# Patient Record
Sex: Female | Born: 1980 | Race: White | Hispanic: No | Marital: Married | Smoking: Never smoker
Health system: Southern US, Community
[De-identification: ages and names within clinical notes are randomized; demographics above are authoritative.]

## PROBLEM LIST (undated history)

## (undated) DIAGNOSIS — R51 Headache: Secondary | ICD-10-CM

## (undated) DIAGNOSIS — K635 Polyp of colon: Secondary | ICD-10-CM

## (undated) DIAGNOSIS — R569 Unspecified convulsions: Secondary | ICD-10-CM

## (undated) DIAGNOSIS — G8929 Other chronic pain: Secondary | ICD-10-CM

## (undated) DIAGNOSIS — J302 Other seasonal allergic rhinitis: Secondary | ICD-10-CM

## (undated) DIAGNOSIS — G40909 Epilepsy, unspecified, not intractable, without status epilepticus: Secondary | ICD-10-CM

## (undated) DIAGNOSIS — T7840XA Allergy, unspecified, initial encounter: Secondary | ICD-10-CM

## (undated) DIAGNOSIS — R519 Headache, unspecified: Secondary | ICD-10-CM

## (undated) HISTORY — DX: Headache: R51

## (undated) HISTORY — DX: Polyp of colon: K63.5

## (undated) HISTORY — DX: Headache, unspecified: R51.9

## (undated) HISTORY — DX: Other seasonal allergic rhinitis: J30.2

## (undated) HISTORY — DX: Epilepsy, unspecified, not intractable, without status epilepticus: G40.909

## (undated) HISTORY — DX: Allergy, unspecified, initial encounter: T78.40XA

## (undated) HISTORY — DX: Other chronic pain: G89.29

---
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
## 2004-06-24 ENCOUNTER — Ambulatory Visit (HOSPITAL_COMMUNITY): Admission: RE | Admit: 2004-06-24 | Discharge: 2004-06-24 | Payer: Self-pay | Admitting: Gynecology

## 2004-11-15 ENCOUNTER — Inpatient Hospital Stay (HOSPITAL_COMMUNITY): Admission: AD | Admit: 2004-11-15 | Discharge: 2004-11-17 | Payer: Self-pay | Admitting: Gynecology

## 2005-05-30 ENCOUNTER — Ambulatory Visit (HOSPITAL_COMMUNITY): Admission: RE | Admit: 2005-05-30 | Discharge: 2005-05-30 | Payer: Self-pay | Admitting: Gynecology

## 2005-08-25 ENCOUNTER — Emergency Department (HOSPITAL_COMMUNITY): Admission: EM | Admit: 2005-08-25 | Discharge: 2005-08-25 | Payer: Self-pay | Admitting: Family Medicine

## 2006-02-27 ENCOUNTER — Ambulatory Visit: Payer: Self-pay | Admitting: Gynecology

## 2007-02-12 ENCOUNTER — Ambulatory Visit: Payer: Self-pay | Admitting: Gynecology

## 2007-02-12 ENCOUNTER — Encounter (INDEPENDENT_AMBULATORY_CARE_PROVIDER_SITE_OTHER): Payer: Self-pay | Admitting: Gynecology

## 2007-07-02 ENCOUNTER — Ambulatory Visit: Payer: Self-pay | Admitting: Gynecology

## 2008-03-10 ENCOUNTER — Encounter (INDEPENDENT_AMBULATORY_CARE_PROVIDER_SITE_OTHER): Payer: Self-pay | Admitting: Gynecology

## 2008-03-10 ENCOUNTER — Ambulatory Visit: Payer: Self-pay | Admitting: Gynecology

## 2008-05-08 ENCOUNTER — Ambulatory Visit: Payer: Self-pay | Admitting: Gynecology

## 2008-05-19 ENCOUNTER — Ambulatory Visit (HOSPITAL_COMMUNITY): Admission: RE | Admit: 2008-05-19 | Discharge: 2008-05-19 | Payer: Self-pay | Admitting: Gynecology

## 2008-05-26 ENCOUNTER — Ambulatory Visit: Payer: Self-pay | Admitting: Family Medicine

## 2008-06-17 ENCOUNTER — Ambulatory Visit: Payer: Self-pay | Admitting: Obstetrics and Gynecology

## 2008-07-14 ENCOUNTER — Ambulatory Visit: Payer: Self-pay | Admitting: Obstetrics & Gynecology

## 2008-07-21 ENCOUNTER — Ambulatory Visit (HOSPITAL_COMMUNITY): Admission: RE | Admit: 2008-07-21 | Discharge: 2008-07-21 | Payer: Self-pay | Admitting: Gynecology

## 2008-08-12 ENCOUNTER — Ambulatory Visit: Payer: Self-pay | Admitting: Obstetrics & Gynecology

## 2008-09-09 ENCOUNTER — Ambulatory Visit: Payer: Self-pay | Admitting: Obstetrics & Gynecology

## 2008-09-29 ENCOUNTER — Ambulatory Visit: Payer: Self-pay | Admitting: Family Medicine

## 2008-09-29 LAB — CONVERTED CEMR LAB
HCT: 33.4 % — ABNORMAL LOW (ref 36.0–46.0)
Hemoglobin: 10.8 g/dL — ABNORMAL LOW (ref 12.0–15.0)
MCHC: 32.3 g/dL (ref 30.0–36.0)
MCV: 96.5 fL (ref 78.0–100.0)
Platelets: 158 10*3/uL (ref 150–400)
RBC: 3.46 M/uL — ABNORMAL LOW (ref 3.87–5.11)
RDW: 13.3 % (ref 11.5–15.5)
WBC: 7.9 10*3/uL (ref 4.0–10.5)

## 2008-10-06 ENCOUNTER — Ambulatory Visit: Payer: Self-pay | Admitting: Obstetrics and Gynecology

## 2008-10-27 ENCOUNTER — Ambulatory Visit: Payer: Self-pay | Admitting: Obstetrics and Gynecology

## 2008-11-11 ENCOUNTER — Ambulatory Visit: Payer: Self-pay | Admitting: Obstetrics & Gynecology

## 2008-11-24 ENCOUNTER — Ambulatory Visit: Payer: Self-pay | Admitting: Obstetrics & Gynecology

## 2008-11-24 ENCOUNTER — Encounter: Payer: Self-pay | Admitting: Family Medicine

## 2008-11-24 LAB — CONVERTED CEMR LAB: GC Probe Amp, Genital: NEGATIVE

## 2008-11-25 ENCOUNTER — Encounter: Payer: Self-pay | Admitting: Family Medicine

## 2008-12-02 ENCOUNTER — Ambulatory Visit: Payer: Self-pay | Admitting: Family Medicine

## 2008-12-08 ENCOUNTER — Ambulatory Visit: Payer: Self-pay | Admitting: Obstetrics and Gynecology

## 2008-12-16 ENCOUNTER — Ambulatory Visit: Payer: Self-pay | Admitting: Obstetrics & Gynecology

## 2008-12-21 ENCOUNTER — Inpatient Hospital Stay (HOSPITAL_COMMUNITY): Admission: AD | Admit: 2008-12-21 | Discharge: 2008-12-21 | Payer: Self-pay | Admitting: Gynecology

## 2008-12-22 ENCOUNTER — Ambulatory Visit: Payer: Self-pay | Admitting: Obstetrics and Gynecology

## 2008-12-26 ENCOUNTER — Ambulatory Visit: Payer: Self-pay | Admitting: Obstetrics & Gynecology

## 2008-12-26 ENCOUNTER — Inpatient Hospital Stay (HOSPITAL_COMMUNITY): Admission: RE | Admit: 2008-12-26 | Discharge: 2008-12-29 | Payer: Self-pay | Admitting: Obstetrics & Gynecology

## 2009-02-09 ENCOUNTER — Ambulatory Visit: Payer: Self-pay | Admitting: Obstetrics and Gynecology

## 2009-02-09 ENCOUNTER — Encounter: Payer: Self-pay | Admitting: Obstetrics and Gynecology

## 2009-02-09 ENCOUNTER — Encounter: Payer: Self-pay | Admitting: Family Medicine

## 2009-02-09 LAB — CONVERTED CEMR LAB
Hemoglobin: 11.9 g/dL — ABNORMAL LOW (ref 12.0–15.0)
MCHC: 31.7 g/dL (ref 30.0–36.0)
RBC: 4.07 M/uL (ref 3.87–5.11)
WBC: 5.5 10*3/uL (ref 4.0–10.5)

## 2009-02-23 ENCOUNTER — Ambulatory Visit: Payer: Self-pay | Admitting: Obstetrics & Gynecology

## 2009-03-23 ENCOUNTER — Ambulatory Visit: Payer: Self-pay | Admitting: Family Medicine

## 2009-03-26 ENCOUNTER — Encounter: Payer: Self-pay | Admitting: Family Medicine

## 2009-03-26 LAB — CONVERTED CEMR LAB
HCT: 37.8 % (ref 36.0–46.0)
Hemoglobin: 12.5 g/dL (ref 12.0–15.0)
MCHC: 33.1 g/dL (ref 30.0–36.0)
MCV: 89.2 fL (ref 78.0–100.0)
Platelets: 168 10*3/uL (ref 150–400)
RBC: 4.24 M/uL (ref 3.87–5.11)
RDW: 13.3 % (ref 11.5–15.5)
WBC: 3.4 10*3/uL — ABNORMAL LOW (ref 4.0–10.5)

## 2009-04-09 ENCOUNTER — Encounter: Payer: Self-pay | Admitting: Family Medicine

## 2009-04-09 ENCOUNTER — Ambulatory Visit: Payer: Self-pay | Admitting: Obstetrics & Gynecology

## 2009-04-09 LAB — CONVERTED CEMR LAB
HCT: 37.3 % (ref 36.0–46.0)
Hemoglobin: 12 g/dL (ref 12.0–15.0)
MCHC: 32.2 g/dL (ref 30.0–36.0)
Platelets: 230 10*3/uL (ref 150–400)
RBC: 4.08 M/uL (ref 3.87–5.11)

## 2009-10-30 IMAGING — CT CT ABDOMEN W/ CM
3 of 5 series · 14 of 32 positions shown, 18 images · IV contrast ([ID] OMNIP 300%)
Comparison: None relevant.

CT ABDOMEN

CLINICAL DATA: Postop cesarean section today with subsequent right
uterine artery hematoma requiring drainage.  Evaluate for ureteral
injury.

CT ABDOMEN AND PELVIS WITH CONTRAST
TECHNIQUE: Multidetector CT imaging of the abdomen and pelvis was
performed using the standard protocol following bolus
administration of intravenous contrast.
Contrast: 100 ml Xmnipaque-EII intravenously.

[Series 2: abd pelvis · axial · 0.70mm/px · z∈[-396,-186]mm · 3 of 86 slices shown, 7 images]
[im 22/86  soft-tissue]
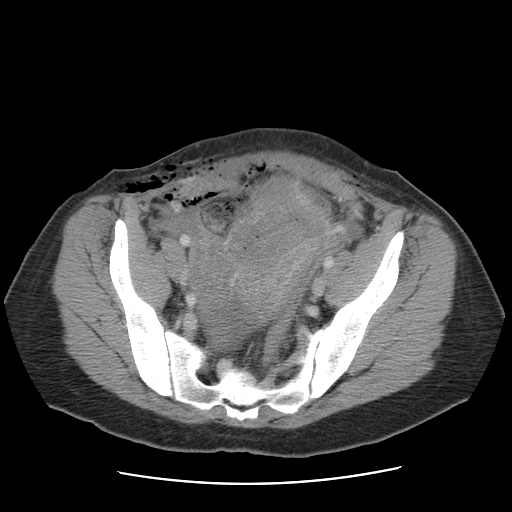
[im 22/86  lung]
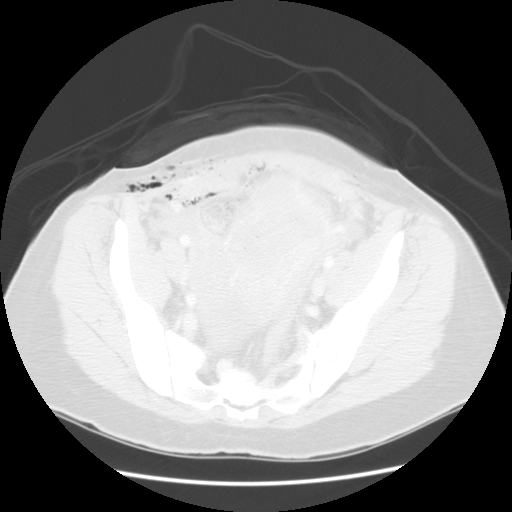
[im 22/86  bone]
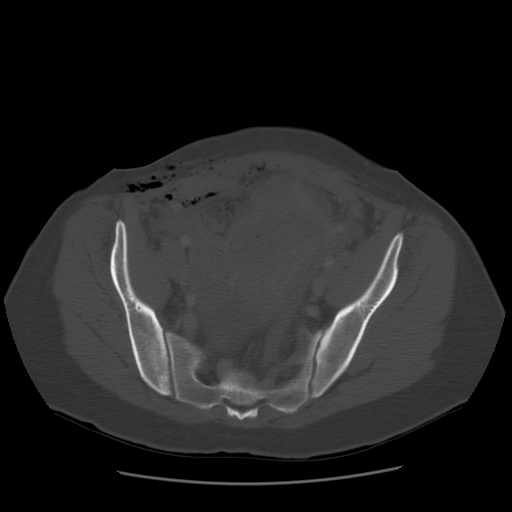
[im 43/86  soft-tissue]
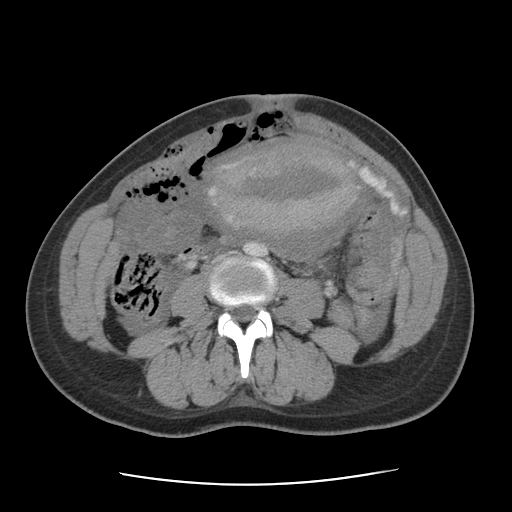
[im 43/86  lung]
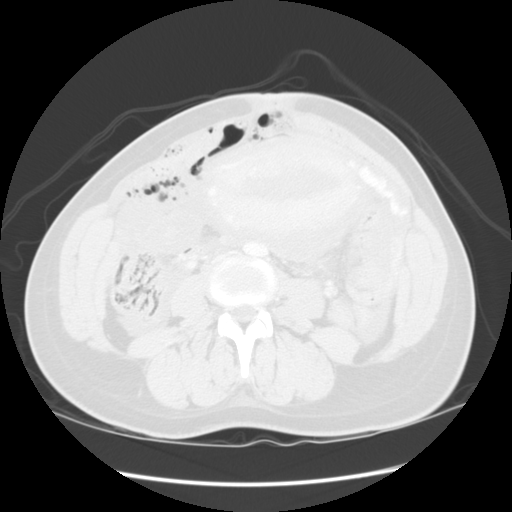
[im 64/86  soft-tissue]
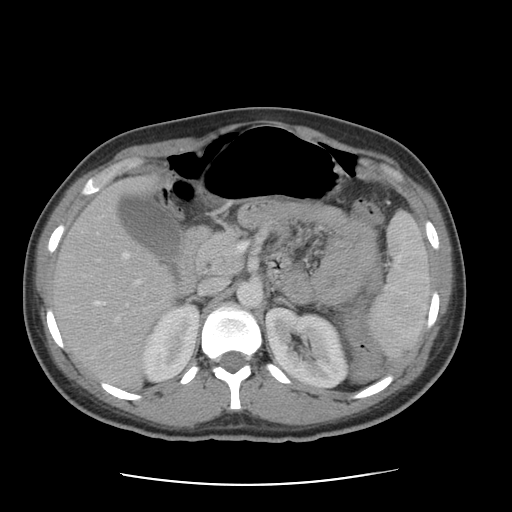
[im 64/86  lung]
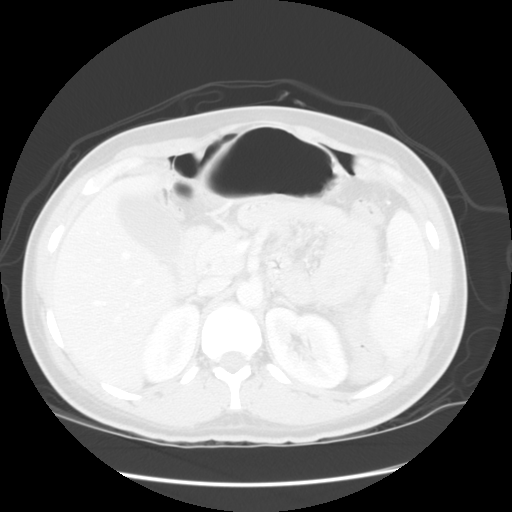

[Series 5: renal delay · axial · delayed · 0.70mm/px · z∈[-390,-220]mm · 3 of 69 slices shown]
[im 18/69  soft-tissue]
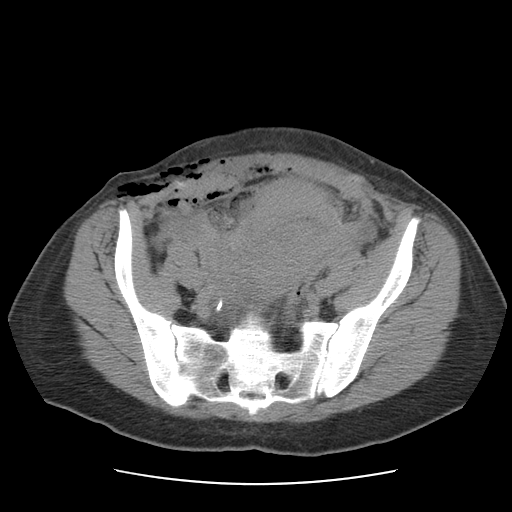
[im 35/69  soft-tissue]
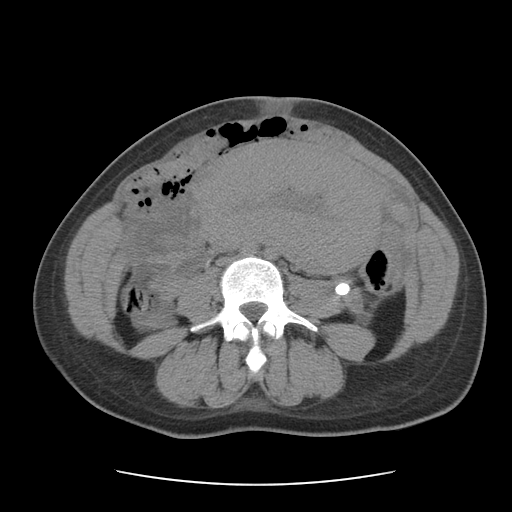
[im 52/69  soft-tissue]
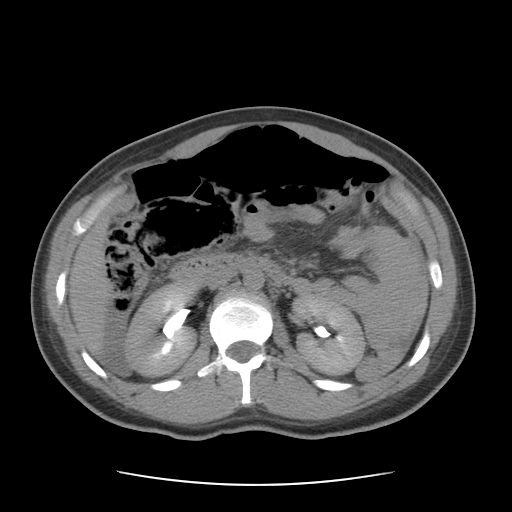

[Series 401: reformatted · sagittal · 0.86mm/px · 8 of 170 slices shown]
[im 17/170  soft-tissue]
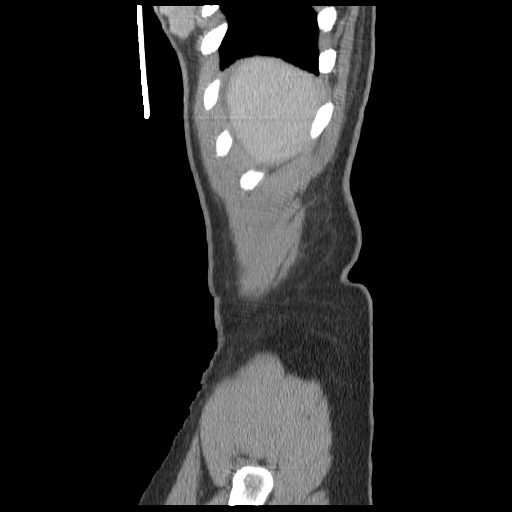
[im 34/170  soft-tissue]
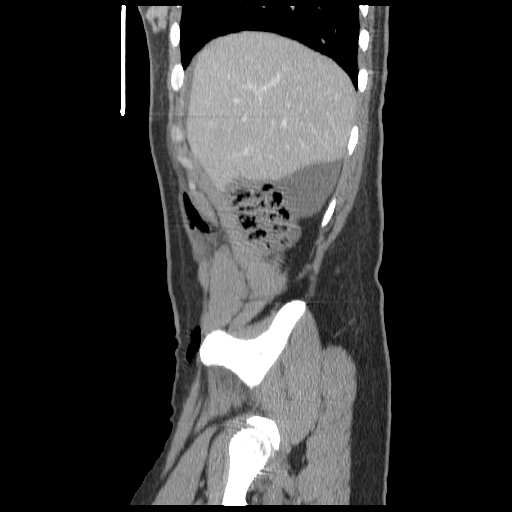
[im 51/170  soft-tissue]
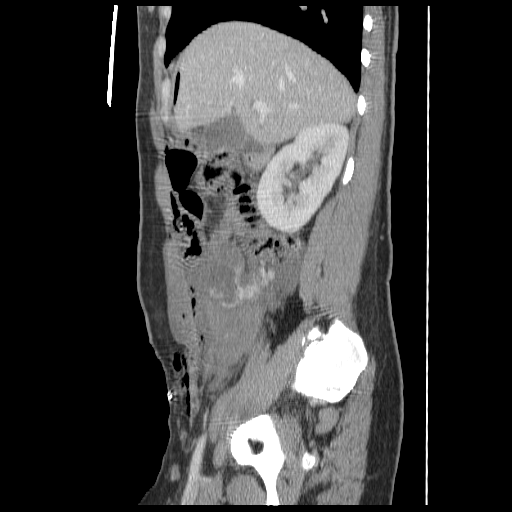
[im 68/170  soft-tissue]
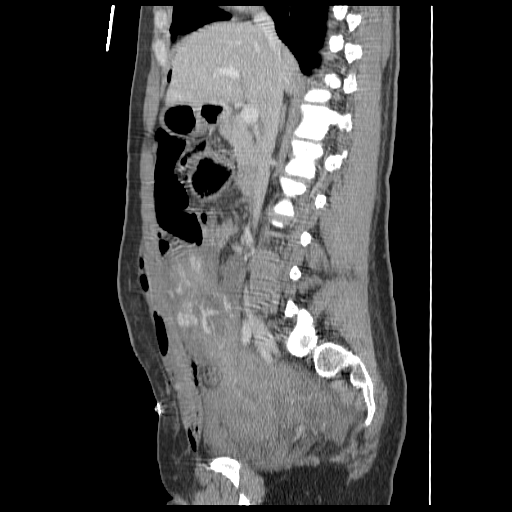
[im 102/170  soft-tissue]
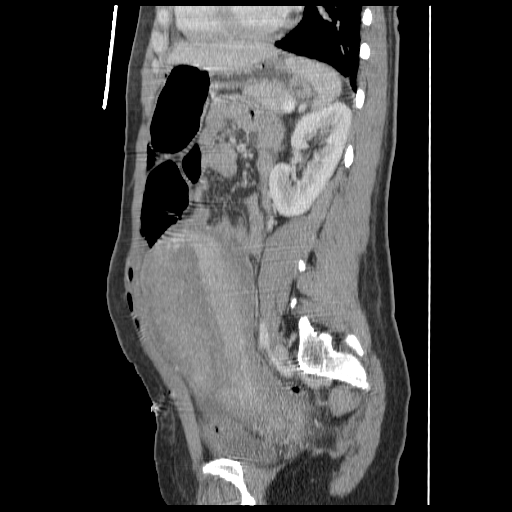
[im 119/170  soft-tissue]
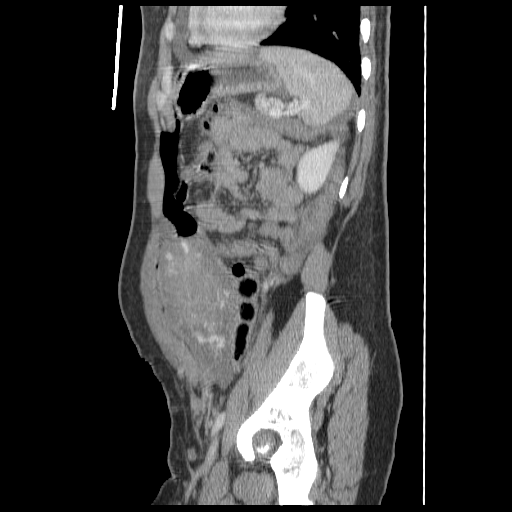
[im 136/170  soft-tissue]
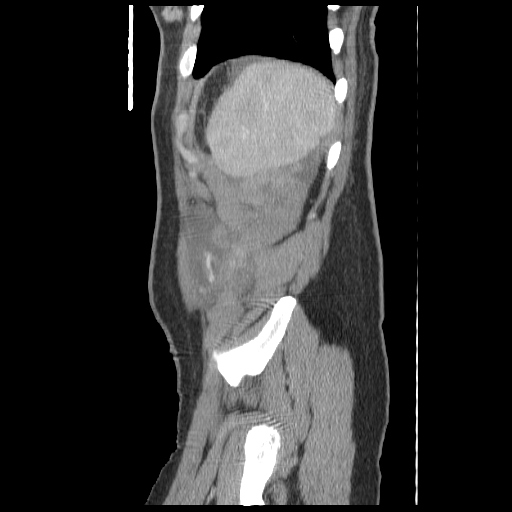
[im 153/170  soft-tissue]
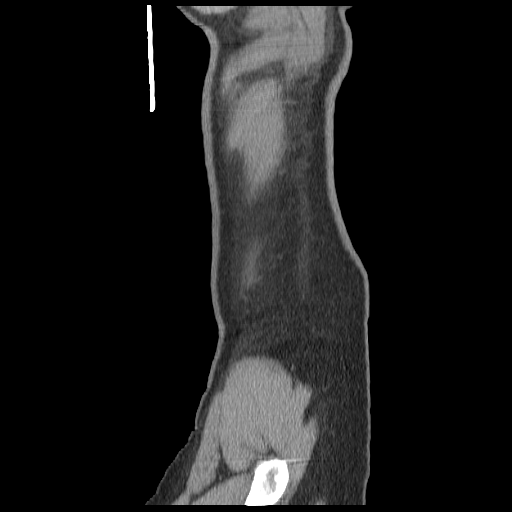

[14 of 32 positions shown; findings below may reference images not displayed]

FINDINGS: The lung bases are clear.  There is no pleural effusion
or basilar pneumothorax.  A small amount of pneumoperitoneum is
present attributed to the recent surgery.  There is a small to
moderate amount of low density ascites.  No definite active
bleeding or hematoma is identified within the abdomen; see below.

Both kidneys appear normal.  There is no hydronephrosis or delay in
contrast excretion.  Delayed images show no evidence of ureteral
injury or extravasation.  The right ureter can be traced to the
urinary bladder.

The liver, spleen, gallbladder, pancreas and adrenal glands appear
normal.
IMPRESSION: 1.  Postsurgical pneumoperitoneum and ascites or low density
hemoperitoneum status post cesarean section.
2.  No evidence of hydronephrosis or ureteral injury.

CT PELVIS
FINDINGS: Enlarged postpartum uterus measures 21.8 cm in length
and extends to the umbilicus.  There is some blood within the
endometrial canal.  There are engorged parametrial vessels and
gonadal veins bilaterally but no definite active bleeding is
identified.  However, there is a globular collection of contrast to
the left of the uterine fundus at the level of the iliac crest
(image 50).  As evaluated on the reformatted images, this is
probably a dilated vessel and does not appear to have enlarged on
the subsequent delayed images to suggest active bleeding.

There is a moderate amount of pelvic hemoperitoneum with hemorrhage
along the right pelvic side wall measuring up to 6.3 x 3.4 cm
transverse on image 70.  This does exert some mass effect on the
urinary bladder and lower uterine segment which are displaced to
the left.  The bladder is decompressed by a Foley catheter.  The
distal ureters appear normal.
IMPRESSION: 1.  No evidence of distal ureteral or bladder injury.
2.  Pelvic hemoperitoneum with known right pelvic side wall
hematoma.
3.  Enlarged postpartum uterus with engorged gonadal and
parametrial vessels.  No definite active bleeding is identified,
but that is difficult to completely exclude in the left peri fundal
region; correlate clinically.

I attempted to call the results to Dr. Josu at the time of
interpretation but was unable to reach her.  A message with my
contact information was left.  Dr. Suetam Ambrioso has reviewed the
results with Dr. Josu.

REF:G5 DICTATED: 12/26/2008 [DATE]

## 2010-03-09 ENCOUNTER — Ambulatory Visit: Payer: Self-pay | Admitting: Nurse Practitioner

## 2011-01-31 ENCOUNTER — Ambulatory Visit: Payer: Self-pay | Admitting: Obstetrics & Gynecology

## 2011-03-08 LAB — URINALYSIS, ROUTINE W REFLEX MICROSCOPIC
Bilirubin Urine: NEGATIVE
Glucose, UA: NEGATIVE mg/dL
Protein, ur: >300 mg/dL — AB

## 2011-03-08 LAB — CBC
HCT: 23 % — ABNORMAL LOW (ref 36.0–46.0)
HCT: 35.8 % — ABNORMAL LOW (ref 36.0–46.0)
Hemoglobin: 12 g/dL (ref 12.0–15.0)
Hemoglobin: 7.8 g/dL — CL (ref 12.0–15.0)
Hemoglobin: 8.6 g/dL — ABNORMAL LOW (ref 12.0–15.0)
Hemoglobin: 8.7 g/dL — ABNORMAL LOW (ref 12.0–15.0)
MCHC: 33.4 g/dL (ref 30.0–36.0)
MCHC: 33.6 g/dL (ref 30.0–36.0)
MCHC: 34 g/dL (ref 30.0–36.0)
MCHC: 34.2 g/dL (ref 30.0–36.0)
MCHC: 35 g/dL (ref 30.0–36.0)
MCV: 92.5 fL (ref 78.0–100.0)
MCV: 92.6 fL (ref 78.0–100.0)
Platelets: 105 10*3/uL — ABNORMAL LOW (ref 150–400)
Platelets: 112 10*3/uL — ABNORMAL LOW (ref 150–400)
Platelets: 140 10*3/uL — ABNORMAL LOW (ref 150–400)
Platelets: 98 10*3/uL — ABNORMAL LOW (ref 150–400)
RBC: 2.68 MIL/uL — ABNORMAL LOW (ref 3.87–5.11)
RDW: 14.4 % (ref 11.5–15.5)
RDW: 14.8 % (ref 11.5–15.5)
RDW: 14.9 % (ref 11.5–15.5)
WBC: 10.9 10*3/uL — ABNORMAL HIGH (ref 4.0–10.5)
WBC: 13.3 10*3/uL — ABNORMAL HIGH (ref 4.0–10.5)
WBC: 8 10*3/uL (ref 4.0–10.5)

## 2011-03-08 LAB — CROSSMATCH

## 2011-03-08 LAB — URINE MICROSCOPIC-ADD ON

## 2011-03-15 ENCOUNTER — Ambulatory Visit (INDEPENDENT_AMBULATORY_CARE_PROVIDER_SITE_OTHER): Payer: Commercial Managed Care - PPO | Admitting: Obstetrics and Gynecology

## 2011-03-15 DIAGNOSIS — Z113 Encounter for screening for infections with a predominantly sexual mode of transmission: Secondary | ICD-10-CM

## 2011-03-15 DIAGNOSIS — Z1272 Encounter for screening for malignant neoplasm of vagina: Secondary | ICD-10-CM

## 2011-03-15 DIAGNOSIS — Z01419 Encounter for gynecological examination (general) (routine) without abnormal findings: Secondary | ICD-10-CM

## 2011-03-16 NOTE — Assessment & Plan Note (Signed)
[name], [name] NO.:  [id number]  MEDICAL RECORD NO.:  [id number]          PATIENT TYPE:  LOCATION:  [facility] at [facility]           FACILITY:  PHYSICIAN:  Jaynie Collins, MD          DATE OF BIRTH:  DATE OF SERVICE:  03/15/2011                                 CLINIC NOTE  REASON FOR VISIT:  Annual examination.  [name] is a 30 year old gravida 2, para 2, who is here for annual gynecologic examination.  The patient does have a Mirena IUD in place and is basically amenorrheic.  She denies any irregular bleeding, abnormal pain, abnormal vaginal discharge, or any other gynecologic symptoms.  The patient does report having a sinus infection that has gotten worse that has persisted for over a week as she is requesting prescription antibiotics at this point to treat her sinus infection. She denies any fevers or any other systemic symptoms.  PAST OBSTETRIC/GYNECOLOGIC HISTORY:  Gravida 2, para 2, two cesarean sections.  No sexually transmitted infections.  Normal Pap smears, last one was in April 2011.  No other gynecologic conditions.  PAST MEDICAL HISTORY:  Epilepsy.  She has not had a seizures in a very long time and seasonal allergies.  PAST SURGICAL HISTORY:  Cesarean section x2.  MEDICATIONS: 1. Mirena IUD that was inserted February 23, 2009. 2. Keppra 750 mg twice a day. 3. Zyrtec.  ALLERGIES:  TEGRETOL which causes hives.  SOCIAL HISTORY:  The patient works as a Scientist, physiological at [employer].  She lives with her family.  She denies any tobacco, alcohol, or illicit drug use.  FAMILY HISTORY:  Remarkable for maternal grandfather who had lung cancer.  No gynecologic, breast, or colon cancer histories.  REVIEW OF SYSTEMS:  A 14-point comprehensive review of systems is entirely negative.  The patient does exercise frequently.  She wears sunscreens, wears her seatbelts, and has no other concerns.  PHYSICAL EXAMINATION:  VITAL SIGNS:   Blood pressure 135/70, pulse 75, weight 141 pounds, height 5 feet 7 inches. GENERAL:  No apparent distress. HEENT:  Normocephalic, atraumatic. NECK:  Supple.  Normal thyroid. LUNGS:  Clear to auscultation bilaterally. HEART:  Regular rate and rate and rhythm. BREASTS:  Symmetric in size, soft, nontender.  No abnormal masses, skin changes, nipple drainage, or lymphadenopathy. ABDOMEN:  Soft, nontender, nondistended.  No organomegaly.  A well-healed Pfannenstiel incision. EXTREMITIES:  No cyanosis, clubbing, or edema and nontender. PELVIC:  Normal external female genitalia.  Pink, well-rugated vagina. Normal cervical contour.  Normal discharge.  No lesions seen.  Pap smear was obtained, small mobile, retroverted uterus.  Normal adnexa bilaterally.  Mirena IUD's were visualized in the cervical canal.  ASSESSMENT AND PLAN:  The patient is a 30 year old gravida 2, para 2, here for annual gynecologic examination.  Pap smear was sent.  Today, she had a normal breast examination.  The patient has no other gynecologic complaints.  She was told to call or come back in for any further gynecologic concerns.  As for her sinus infection, the patient was given a prescription for the azithromycin (Z-Pak) and told to take as directed.  She was told to follow up with  her primary care physician if her symptoms do not get better.          ______________________________ Jaynie Collins, MD    UA/MEDQ  D:  03/15/2011  T:  03/16/2011  Job:  [id number]

## 2011-04-05 NOTE — Discharge Summary (Signed)
NAMEBRAELYNN, [name] NO.:  [id number]   MEDICAL RECORD NO.:  [id number]         PATIENT TYPE:  WINP   LOCATION:                                FACILITY:  [facility]   PHYSICIAN:  Norton Blizzard, MD    DATE OF BIRTH:  October 14, 1981   DATE OF ADMISSION:  12/26/2008  DATE OF DISCHARGE:                               DISCHARGE SUMMARY   DISCHARGE DIAGNOSES:  1. Repeat lower transverse cesarean section.  2. Right broad ligament retroperitoneal hematoma.  3. History of seizures.  4. Gastroesophageal reflux disease.  5. Anemia.   DISCHARGE MEDICATIONS:  1. Keppra.  2. Prenatal vitamin 1 p.o. daily.  3. Ibuprofen 600 mg 1 tablet p.o. q.6 h. as needed for postpartum      pain.  4. Iron sulfate 325 mg 1 tablet by mouth twice a day for postpartum      anemia.   LABS:  1. CBC on December 26, 2008: WBC 8.0, hemoglobin 12.0, hematocrit 35.8,      and platelet count 126.  2. Blood type A positive.  3. UA negative.  4. RPR nonreactive.  5. CBC on December 26, 2008 postsurgery:  WBC 12.3, hemoglobin 9.3, and      platelet count 98.  6. CBC on December 29, 2008:  WBC 8.3, hemoglobin 7.8, and platelet      count 138.   BRIEF HOSPITAL COURSE:  The patient is a 30 year old G2, P 2-0-0-2 with  a repeat low-transverse cesarean section, a right broad ligament  retroperitoneal hematoma, and subsequent anemia.  1. Repeat low transverse cesarean section:  The patient was taken to      the operating room on December 26, 2008 for repeat low transverse C-      section.  The patient was at 41 weeks and was taken to the OR      because of failure of spontaneous labor and for elective repeat C-      section.  A repeat low transverse cesarean section was performed      under normal fashion by Dr. Johnella Moloney.  This was done under      spinal anesthesia.  They delivered a viable female infant with      meconium-retained fluid.  Apgars were 9 and 9.  Weight was 9 pounds      5 ounces.  Spontaneous  placenta was intact with the three-vessel      cord.  After surgery, the patient underwent routine  postpartum      care.  On the day of discharge, the patient was feeling fine      without complaints.  She was up ambulating and was tolerating p.o.,      was voiding, and was only complaining of mild abdominal crampy      pain, well controlled with ibuprofen.  The patient's incision was      clean, dry, and intact.  The patient was in complete understanding      and agreement with discharge.  2. Right large hematoma from a laceration of the right uterine artery  into the broad ligament:  During the operation, the right uterine      artery was nicked and a 5 cm x 7 cm retroperitoneal hematoma      developed.  Dr. Osborn Coho was called into the OR to help and      the hematoma was contained.  The patient was given IV fluid bolus      as well as 1 unit of packed red blood cells.  Estimated blood loss      was 1000 mL.  After the procedure, the patient was monitored      closely.  The patient remained stable after surgery.  Serial CBCs      did show a slight downtrend in hemoglobin.  On the day of      discharge, the patient's hemoglobin was 7.8.  The patient was      asymptomatic from this anemia.  The patient denied any      lightheadedness, dizziness, fatigue, or feeling like her heart      tracing.  The patient had good cap refill in her extremities and      had good peripheral pulses.  The patient had no obvious signs of      retroperitoneal bleeding or bruising.  The patient has denied any      back or flank pain.  3. Anemia.  The patient's hemoglobin on admission was 12.0.  On the      day of discharge, it was 7.8.  The anemia is most likely from this      hematoma as well as to mild vaginal bleeding.  On the day of      discharge, the patient's vaginal bleeding was greatly decreased.      The patient had no signs of any active bleeding.  The patient will      be discharged  home with iron sulfate.  4. History of seizures.  The patient has not had seizures for 4 years.      However, we will continue the patient's Keppra.  The patient should      resume home dose of Keppra and will continue taking home dose of      Keppra.  5. Disposition.  The patient will be breast feeding.  The patient will      use Mirena with no bridge for contraception.  The patient will be      discharged home.   DISCHARGE INSTRUCTIONS:  The patient is to seek medical care for any  signs of bleeding, if she becomes lightheaded, dizzy, or develops  fatigue, the patient should have pelvic rest for 6 weeks.  The patient  has no restrictions on her diet.   FOLLOWUP:  The patient is to follow up with [facility] in 2 days for  to have her hemoglobin checked and should follow up in 6 weeks for her  routine postpartum care.   DISCHARGE CONDITION:  Stable.      Toni Sole, MD      Norton Blizzard, MD  Electronically Signed    WS/MEDQ  D:  12/29/2008  T:  12/29/2008  Job:  [phone]

## 2011-04-05 NOTE — Assessment & Plan Note (Signed)
[name], [name] NO.:  [id number]   MEDICAL RECORD NO.:  [id number]          PATIENT TYPE:  POB   LOCATION:  [facility] at [facility]         FACILITY:  [facility]   PHYSICIAN:  Jaynie Collins, MD     DATE OF BIRTH:  09-May-1981   DATE OF SERVICE:  03/09/2010                                  CLINIC NOTE   The patient is a 30 year old, gravida 2, para 2 with last menstrual  period February 10, 2010, who is here for annual gynecologic examination.  The patient denies any irregular bleeding, abnormal pain, abnormal  vaginal discharge, or any other gynecologic symptoms.   PAST OBSTETRICS AND GYNECOLOGIC HISTORY:  Gravida 2, para 2, 2 cesarean  sections.  Normal Pap smears.  Last one was in February 09, 2009.  No  sexually transmitted infections.   PAST MEDICAL HISTORY:  Epilepsy.   PAST SURGICAL HISTORY:  Cesarean section x2.   MEDICATIONS:  Mirena IUD which was inserted February 23, 2009, Keppra 750  mg.   ALLERGIES:  TEGRETOL which causes hives.   SOCIAL HISTORY:  The patient works as a Scientist, physiological at [employer].  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  She denies any  past or current history of physical or sexual abuse.  She denies any  tobacco, alcohol, or illicit drug use.   FAMILY HISTORY:  Remarkable for maternal grandfather, who had lung  cancer.  No gynecologic breast or colon cancers.   REVIEW OF SYSTEMS:  A 14-point comprehensive review of systems was  entirely negative.  The patient does exercise frequently, wears  sunscreen always,and has no other concerns.   PHYSICAL EXAMINATION:  VITAL SIGNS:  Blood pressure 126/79, pulse 76,  weight 141, height 5 feet 7 inches.  GENERAL:  No apparent distress.  HEENT:  Normocephalic, atraumatic.  NECK:  Supple.  Normal.  No masses.  Normal thyroid.  LUNGS:  Clear to auscultation bilaterally.  HEART:  Regular rate and rhythm.  BREASTS:  Symmetric in size, soft, nontender, no abnormal masses, skin  changes, drainage, or lymphadenopathy.  ABDOMEN:  Soft, nontender, nondistended.  No organomegaly.  Well-healed  Pfannenstiel incisions.  EXTREMITIES:  No clubbing, cyanosis, or edema.  Nontender.  PELVIC:  Normal external female genitalia.  Pink and well rugated  vagina.  Normal cervical contour.  Normal vaginal discharge.  Pap smear  was obtained.  On bimanual exam, the patient has a small mobile,  retroverted uterus, normal adnexa bilaterally.  No tenderness on  bimanual examination.  Of note, the Mirena IUD strings were visualized  and were in the cervical canal.  The strings were cut to be flush with  the external cervical os.   ASSESSMENT AND PLAN:  The patient is a 30 year old, gravida 2, para 2,  here for her annual gynecologic examination and Pap smear was sent.  We  will follow up results.  She also had a normal breast examination and no  other gynecologic symptoms.  The patient was told to call back if she  has any further concerns.  She is satisfied with her Mirena IUD for now  and the strings were visualized on  examination.  The patient was  encouraged to call, will come back in for any further gynecologic  concerns.           ______________________________  Jaynie Collins, MD     UA/MEDQ  D:  03/09/2010  T:  03/10/2010  Job:  [id number]

## 2011-04-05 NOTE — Assessment & Plan Note (Signed)
NAMEAMAYIA, [name] NO.:  [id number]   MEDICAL RECORD NO.:  [id number]          PATIENT TYPE:  POB   LOCATION:  [facility]         FACILITY:  [facility]   PHYSICIAN:  Ginger Carne, MD DATE OF BIRTH:  Aug 07, 1981   DATE OF SERVICE:  03/10/2008                                  CLINIC NOTE   Ms. Fredenburg returns today for routine gynecological evaluation.  She has  been attempting pregnancy since 07/2007.  Her menses are about every 35  days but at this time does not want intervention in the form of Clomid.  She is taking Keppra for seizure disorder which is a category C drug.  At this time she had stopped taking prenatal vitamins due to nausea and  I advised her to take two Flintstone's daily.  Her overall GYN exam is  normal for purposes of brevity. Please refer to the physical check off  sheet.  The patient was advised that if she is not pregnant in about 6  months or wishes to engage in fertility medications sooner to contact  our office. Patient was advised to increase her folic acid to 4 mg  daily.           ______________________________  Ginger Carne, MD     SHB/MEDQ  D:  03/10/2008  T:  03/10/2008  Job:  [id number]

## 2011-04-05 NOTE — Assessment & Plan Note (Signed)
[name], Toni NO.:  [id number]   MEDICAL RECORD NO.:  [id number]          PATIENT TYPE:  POB   LOCATION:  [facility]         FACILITY:  [facility]   PHYSICIAN:  Scheryl Darter, MD       DATE OF BIRTH:  06-23-1981   DATE OF SERVICE:                                  CLINIC NOTE   The patient comes for insertion of Mirena.  She had this inserted prior  to her last pregnancy.  __________ discussed and the risks were  discussed.  She signed consent.  She says she has been using condoms for  contraception.  She had negative UPT today.   PHYSICAL EXAMINATION:  GENERAL:  The patient is in no acute distress and  pleasant.  PELVIC:  External genital, vagina and cervix appeared normal.  Uterus  normal in size, nontender.  Cervix was grasped with single-tooth  tenaculum and prepped with Betadine.  Uterus sounded to 9 cm.  Mirena  was placed without difficulty.  The string was trimmed at 3 cm.  __________.  The patient tolerated the procedure well without  complications.  She will return in 4 weeks for examination.  She will  report if she has heavy bleeding, severe pain, abnormal discharge, or  fever.      Scheryl Darter, MD     JA/MEDQ  D:  02/23/2009  T:  02/24/2009  Job:  [id number]

## 2011-04-05 NOTE — Op Note (Signed)
[name], [name] NO.:  [id number]   MEDICAL RECORD NO.:  [id number]         PATIENT TYPE:  WINP   LOCATION:                                FACILITY:  [facility]   PHYSICIAN:  Norton Blizzard, MD    DATE OF BIRTH:  [birth year]   DATE OF PROCEDURE:  12/26/2008  DATE OF DISCHARGE:                               OPERATIVE REPORT   PREOPERATIVE DIAGNOSES:  1. Intrauterine pregnancy at 41 weeks' gestation.  2. Failure of spontaneous labor after a history of prior cesarean      section.  3. Desires elective repeat cesarean section.   POSTOPERATIVE DIAGNOSIS:  1. Intrauterine pregnancy at 41 weeks' gestation.  2. Failure of spontaneous labor after a history of prior cesarean      section.  3. Desires elective repeat cesarean section.   PROCEDURE:  Repeat low transverse cesarean section via Pfannenstiel  incision.   SURGEON:  Norton Blizzard, MD   ASSISTANT:  Odie Sera, DO, Osborn Coho, M.D., Claudean Severance, [place]  student.   ANESTHESIA:  Spinal.   IV FLUIDS:  3500 mL of lactated Ringer's and 1 unit of packed red blood  cells.   ESTIMATED BLOOD LOSS:  1000 mL.   URINE OUTPUT:  300 mL of clear yellow urine.   INDICATIONS:  The patient is a 30 year old gravida 2, para 1 at 74 weeks  who presented today for elective repeat cesarean section.  Prior during  her prenatal course, the patient did opt for trial of labor after a  cesarean section, but was unable to have spontaneous labor.  At her 29-  week visit, the patient decided to proceed with an elective repeat  cesarean section, which was scheduled today.  The risks of surgery,  which include but not limited to bleeding which require transfusion,  infection which require antibiotics, injury to surrounding organs  including the bladder, ureters, bowel and possible need for other  procedures including hysterectomy in the event of a life-threatening  bleed were discussed with the patient and written informed  consent was  obtained.   FINDINGS:  Viable female infant in cephalic presentation.  There was  light meconium stained amniotic fluid.  Apgars of the infant where 8 and  9.  Weight 9 pounds 5 ounces.  There was spontaneous placental delivery  intact with three-vessel cord.  There was a large hematoma from  laceration of the right uterine artery into the broad ligament that  measured about 5 cm in width and 7 cm in length.  Otherwise, normal  uterus, tubes, and ovaries bilaterally.   SPECIMENS:  Placenta and cord blood, which were sent to labor and  delivery.   COMPLICATIONS:  Hematoma from laceration of right uterine artery as  described above.   DESCRIPTION OF PROCEDURE:  The patient received preoperative intravenous  Ancef, and was taken to the operating room where a spinal analgesia was  administered and found to be adequate.  She was then placed in the  dorsal supine position with a leftward tilt, and prepped and draped in a  sterile  manner.  A Pfannenstiel incision was made over her preexisting  scar and carried down to the underlying layer of fascia using a scalpel.  The incision was incised in the midline and was extended bilaterally  using Mayo scissors.  Kochers were applied to the superior aspect of the  fascial incision and the underlying rectus muscles were dissected off  bluntly.  A similar process was carried out on the inferior aspect.  The  rectus muscles were separated in the midline bluntly and peritoneum was  entered bluntly.  This peritoneal incision was extended inferiorly and  superiorly with good visualization of bowel and bladder using a  combination of blunt and sharp methods.  Attention was then turned to  the patient's lower uterine segment where a low transverse hysterotomy  was made with a scalpel and extended bilaterally in a blunt fashion.  The aminotic sac was encountered and ruptured for lightly meconium  stained fluid.  The infant's head was  encountered and delivered  atraumatically.  The nares and mouth were suctioned with bulb suction  and the rest of the infant's body was delivered without complication.  The cord was clamped and cut and the infant was handed over to the  awaiting neonatologist.  Fundal massage was then administered and the  placenta delivered intact with three-vessel cord that was noted.  At  this point, it was noted that there was significant amount of bleeding  coming from the right side of the hysterotomy; a laceration into the  right uterine artery was suspected at this point and a ring forceps was  placed on this area.  The uterus was exteriorized and cleared of all  clot and debris.  The hysterotomy was then repaired using 0 Vicryl in a  running interlocking fashion with care given to incorporate the area  around the right uterine arteries.  Initially good hemostasis was noted  and an imbricating layer using 0 Monocryl was also done at this point.  Upon evaluation of the repair of the hysterotomy, it was noted that  there was a small hematoma that was noted to be arising from the right  lateral aspect of the hysterotomy.  No obvious external bleeding was  noted at this point and pressure was applied using laparotomy sponges  and it was observed for a few minutes while the ovaries and fallopian  tubes were examined and found to be normal.  When attention was returned  to the hematoma, it was noted to have expanded to about a 5 cm width and  an 7 cm length.  The decision was made to try to isolate the uterine  artery and the vessels on the right side for ligation.  The part of the  hysterotomy repair on the right was loosened and the bladder was further  dissected down from the right lateral aspect of the uterus.  At this  point, I did call in Dr. Osborn Coho, who was one of the attendings  in the operating room to assist with this procedure.  Stitches were then  placed using 3-0 Vicryl around the  uterine vessels in an attempt to  ligate the uterine vessels and obtain hemostasis.  A few figure-of-eight  stitches were placed around the vessels.  It was difficult to try to  demarcate where the ureter was in relation to where the stitches were  been placed, but there was not any obvious injury to the ureter that was  noted.  After a few stitches were placed  good hemostasis was observed on  the right and the hematoma was noted not be expanding.  The pelvis was  cleared of all clot and debris and the uterus was returned to the  abdomen.  A few more minutes were devoted to watch and to see if the  hematoma expanded, but this did not happen.  Given her blood loss, which  at this point was estimated to be about 800 mL from the procedure and  about 200 mL in hematoma, the decision was made to transfuse her 1 unit  of packed red blood cells, which she got using the rapid infuser in the  operating room.  Some Surgicel was also placed in the area of the  stitches for further hemostasis.  The peritoneum was then reapproximated  over the lower uterine segment using 3-0 Vicryl interrupted stitches.  Hemostasis was confirmed on all surfaces.  The fascia was then  reapproximated using 0 PDS in a running fashion.  The subcutaneous  layers were irrigated with a wet sponge and bleeders were controlled  using electrocautery.  The skin was then closed with staples.  The  patient tolerated the procedure and the transfusion well.  Sponge,  instrument and needle counts were correct x3.  She was taken to the  recovery room in stable condition.  Of  note, the patient will have a CBC that will be drawn 4 hours after her  transfusion to determine the need for further transfusion or further  bleeding into the hematoma or into her abdomen.  She will also be  scheduled for CT urogram after this procedure in order to evaluate for  possible injury to the right ureter.      Norton Blizzard, MD  Electronically  Signed     UAD/MEDQ  D:  12/26/2008  T:  12/27/2008  Job:  [id number]   cc:   Osborn Coho, M.D.  Fax: [phone]

## 2011-04-08 NOTE — Op Note (Signed)
[name], [name] NO.:  [id number]   MEDICAL RECORD NO.:  [id number]          PATIENT TYPE:  INP   LOCATION:  9106                          FACILITY:  [facility]   PHYSICIAN:  Ginger Carne, MD  DATE OF BIRTH:  October 23, 1981   DATE OF PROCEDURE:  11/15/2004  DATE OF DISCHARGE:                                 OPERATIVE REPORT   PREOPERATIVE DIAGNOSIS:  Cephalopelvic disproportion.   POSTOPERATIVE DIAGNOSES:  1.  Cephalopelvic disproportion.  2.  Viable delivery of female infant.   PROCEDURE:  Primary low transverse cesarean section.   SURGEON:  Dr. Mia Creek   ASSISTANT:  None.   COMPLICATIONS:  None immediate.   ESTIMATED BLOOD LOSS:  1500 mL.   COMPLICATIONS:  Uterine atony.   SPECIMEN:  Cord bloods.   OPERATIVE FINDINGS:  Term infant female was delivered on December 26, 205, in  the vertex presentation.  The fetus was posterior and deflexed.  The uterus,  tubes, and ovaries showed normal and usual changes of pregnancy.  Placenta  was complete three vessels with a central cord.  Amniotic fluid was clear  without foul smell.  The uterus developed atony immediately postpartum,  related to infant weighing 10 pounds 1 ounce.  Apgar and weight per delivery  room record.  The patient received Methergine 0.2 mg IM, Hemabate 250 mcg IM  in the thigh and 1000 mg of Cervidil per rectum.  With continued atony, a  series of 5 sutures were placed in a meatloaf fashion, starting below the  tubes bilaterally and extending down to the lower uterine segment just above  the transverse incision on the uterus.  At this point, the uterus was well-  contracted and tied down with minimal bleeding noted intravaginally.   OPERATIVE PROCEDURE:  The patient prepped and draped in the usual fashion  and placed in left lateral supine position.  Betadine solution used for  antiseptic, and the patient was catheterized prior to the procedure.  After  adequate spinal analgesia, a  Pfannenstiel incision was made and the abdomen  opened.  Lower uterine segment was incised transversely after developing the  bladder flap.  Baby delivered, cord clamped, cut, and infant given to the  pediatric staff after bulb suctioning.  The uterus removed manually, uterus  inspected.  Aforementioned procedures were performed to effect uterine  atony.  The uterus was closed in one layer with 0 Vicryl running  interlocking suture.  Following this, a meatloaf suture technique was  utilized, starting anteriorly and going to the posterior wall and through  the posterior wall of the uterus and starting immediately below the entrance  of the tubes on either side laterally.  Afterwards, the suture then was  placed through the posterior wall and exited through the anterior wall and  tied down.  A series of five of these sutures were placed, extending  immediately above the transverse uterine incision.  Uterus was well tied  down and clamped, minimal bleeding noted as described.  Bleeding points were  hemostatically checked, blood clots removed from the abdomen.  Closure of  the parietal peritoneum with 2-0 Vicryl suture, 0 Vicryl running for the  fascia, 3-0 Vicryl for the subcutaneous layer, and 4-0 Prolene for a  subcuticular closure.  The patient tolerated the procedure well and  transported to the postanesthesia recovery room in excellent condition.     Stev   SHB/MEDQ  D:  11/15/2004  T:  11/15/2004  Job:  [id number]

## 2011-04-08 NOTE — Discharge Summary (Signed)
[name], [name] NO.:  [id number]   MEDICAL RECORD NO.:  [id number]          PATIENT TYPE:  INP   LOCATION:  9106                          [facility]ACILITY:  [facility]   PHYSICIAN:  Ginger Carne, MD  DATE OF BIRTH:  1981/05/20   DATE OF ADMISSION:  11/15/2004  DATE OF DISCHARGE:                                 DISCHARGE SUMMARY   FINAL DIAGNOSES:  1.  Term viable delivery of female infant.  2.  Cephalopelvic disproportion.  3.  History of epilepsy, on medication.   IN-HOSPITAL PROCEDURES:  1.  Primary low transverse cesarean section.  2.  Medical induction.   HOSPITAL COURSE:  This is a 30 year old gravida 1, para 1 0-0-1 Caucasian  female who underwent a medical induction on her due date because of history  of epilepsy and the risks of stillborn being three times greater than the  general population.  The patient did not advance beyond 2 to 3 cm despite  adequate [place] units for at least 6 hours.  A 10 pound 1 ounce female was  delivered in the early evening of November 15, 2004.  Postoperative course  was uneventful.  She was afebrile.  Voided well.  Incision was dry and  minimal flow.  The patient did undergo an atony at the time of her surgery,  which was controlled with medications and additional suturing of the uterus.  Her postoperative hemoglobin was 9.3 from preoperative of 12.8 and postop  hematocrit 26.8% from preop of 32.9%.   Routine postoperative and postpartum instructions provided to said patient.  Percocet 5/325 prescribed for patient for analgesia and an intrauterine  device will be ordered for the patient (Mirena).  She will return in 2 to 3  days to have her subcuticular suture removed and then her routine 4 to 6  week postoperative and postpartum visit.     Stev   SHB/MEDQ  D:  11/17/2004  T:  11/17/2004  Job:  [id number]

## 2012-01-26 ENCOUNTER — Emergency Department (INDEPENDENT_AMBULATORY_CARE_PROVIDER_SITE_OTHER)
Admission: EM | Admit: 2012-01-26 | Discharge: 2012-01-26 | Disposition: A | Discharge status: Home / Self Care | Payer: Commercial Managed Care - PPO | Source: Home / Self Care

## 2012-01-26 ENCOUNTER — Encounter (HOSPITAL_COMMUNITY): Payer: Self-pay | Admitting: *Deleted

## 2012-01-26 DIAGNOSIS — S060XAA Concussion with loss of consciousness status unknown, initial encounter: Secondary | ICD-10-CM

## 2012-01-26 DIAGNOSIS — S060X9A Concussion with loss of consciousness of unspecified duration, initial encounter: Secondary | ICD-10-CM

## 2012-01-26 DIAGNOSIS — G40909 Epilepsy, unspecified, not intractable, without status epilepticus: Secondary | ICD-10-CM

## 2012-01-26 HISTORY — DX: Unspecified convulsions: R56.9

## 2012-01-26 NOTE — ED Notes (Signed)
Husband w/ patient and pt instructed to stay lying down and not to get up w/o assistance

## 2012-01-26 NOTE — ED Notes (Signed)
States had 3 complete seizures lasting approximately 2 minutes with 2 - 3 hours between seizures.  Pt states she has been tested repeatedly for epilepsy w/ negative results. C/o bumped head and states back of head is sore, states vomited Monday and states continues to be dizzy.

## 2012-01-26 NOTE — ED Provider Notes (Signed)
History     CSN: [id number]  Arrival date & time 01/26/12  1707   None     Chief Complaint  Patient presents with  . Seizures    (Consider location/radiation/quality/duration/timing/severity/associated sxs/prior treatment) HPI Comments: Patient presents this evening with her husband. She reports that she had 3 seizures 3 days ago. She has a history of seizure disorder, with her last seizure approximately 5 years ago. She is taking her seizure medication as prescribed and is uncertain what might have triggered the seizures. She states that she had the first seizure she was at work fell backwards and hit her head on the floor. Paramedics were called and she was evaluated. She denies headaches but states that she has some tenderness on the crown of her head where she hit her head on the floor. She's concerned because she continues to have intermittent dizziness for last 3 days since the head injury. She notices this with rolling over or with changing positions. She vomited twice on the day of symptoms seizures but none since then. Her appetite is normal and no nausea. She denies visual changes, numbness or tingling.   Past Medical History  Diagnosis Date  . Seizures     Past Surgical History  Procedure Date  . Cesarean section     History reviewed. No pertinent family history.  History  Substance Use Topics  . Smoking status: Never Smoker   . Smokeless tobacco: Not on file  . Alcohol Use: Yes    OB History    Grav Para Term Preterm Abortions TAB SAB Ect Mult Living   2 2              Review of Systems  All other systems reviewed and are negative.    Allergies  Tegretol  Home Medications   Current Outpatient Rx  Name Route Sig Dispense Refill  . LEVETIRACETAM 750 MG PO TABS Oral Take 750 mg by mouth every 12 (twelve) hours.      BP 113/71  Pulse 69  Temp(Src) 98.2 F (36.8 C) (Oral)  Resp 16  SpO2 100%  Physical Exam  Constitutional: She appears  well-developed and well-nourished. No distress.  HENT:  Head: Normocephalic and atraumatic. Head is without abrasion, without contusion and without laceration.    Right Ear: Tympanic membrane, external ear and ear canal normal.  Left Ear: Tympanic membrane, external ear and ear canal normal.  Nose: Nose normal.  Mouth/Throat: Uvula is midline, oropharynx is clear and moist and mucous membranes are normal. No oropharyngeal exudate, posterior oropharyngeal edema or posterior oropharyngeal erythema.  Eyes: Conjunctivae, EOM and lids are normal.  Fundoscopic exam:      The right eye shows no hemorrhage and no papilledema.       The left eye shows no hemorrhage and no papilledema.  Neck: Neck supple.  Cardiovascular: Normal rate, regular rhythm and normal heart sounds.   Pulmonary/Chest: Effort normal and breath sounds normal. No respiratory distress.  Musculoskeletal:       Cervical back: She exhibits normal range of motion, no tenderness, no swelling, no deformity and no spasm.  Lymphadenopathy:    She has no cervical adenopathy.  Neurological: She is alert. She has normal strength. No cranial nerve deficit. She displays a negative Romberg sign. Gait normal.  Reflex Scores:      Bicep reflexes are 2+ on the right side and 2+ on the left side. Skin: Skin is warm and dry.  Psychiatric: She has a normal mood  and affect.    ED Course  Procedures (including critical care time)  Labs Reviewed - No data to display No results found.   1. Mild concussion   2. Seizure disorder       MDM  Mild dizziness since head injury from seizures 3 days ago. Hx of seizure d/o. Pt does encouraged to schedule f/u appt with [facility] for next week. Advised no driving - husband states he has been driving her.          Melody Comas, PA 01/26/12 1957  Melody Comas, PA 01/26/12 1958

## 2012-01-26 NOTE — Discharge Instructions (Signed)
Increase fluids and rest. Tylenol, Ibuprofen, or Naproxen as needed for discomfort. Ice packs as needed to your scalp will also help with discomfort. Call Dr Zannie Cove office to schedule a follow up appt for next week. If your symptoms change or worsen go to the ER.

## 2012-01-27 NOTE — ED Provider Notes (Signed)
Medical screening examination/treatment/procedure(s) were performed by resident physician or non-physician practitioner and as supervising physician I was immediately available for consultation/collaboration.   Fahim Kats DOUGLAS MD.    Kiondre Grenz Douglas Aleanna Menge, MD 01/27/12 0825 

## 2012-04-18 ENCOUNTER — Encounter: Payer: Self-pay | Admitting: Family Medicine

## 2012-04-18 ENCOUNTER — Ambulatory Visit (INDEPENDENT_AMBULATORY_CARE_PROVIDER_SITE_OTHER): Payer: BC Managed Care – PPO | Admitting: Family Medicine

## 2012-04-18 VITALS — BP 94/62 | HR 70 | Ht 67.0 in | Wt 145.0 lb

## 2012-04-18 DIAGNOSIS — Z1151 Encounter for screening for human papillomavirus (HPV): Secondary | ICD-10-CM

## 2012-04-18 DIAGNOSIS — Z124 Encounter for screening for malignant neoplasm of cervix: Secondary | ICD-10-CM

## 2012-04-18 DIAGNOSIS — Z01419 Encounter for gynecological examination (general) (routine) without abnormal findings: Secondary | ICD-10-CM

## 2012-04-18 LAB — CBC
HCT: 37.5 % (ref 36.0–46.0)
Hemoglobin: 12.7 g/dL (ref 12.0–15.0)
MCH: 31.1 pg (ref 26.0–34.0)
MCHC: 33.9 g/dL (ref 30.0–36.0)
MCV: 91.9 fL (ref 78.0–100.0)
Platelets: 217 K/uL (ref 150–400)
RBC: 4.08 MIL/uL (ref 3.87–5.11)
RDW: 14.4 % (ref 11.5–15.5)
WBC: 4.5 K/uL (ref 4.0–10.5)

## 2012-04-18 LAB — LIPID PANEL
Cholesterol: 139 mg/dL (ref 0–200)
HDL: 64 mg/dL (ref >39)
LDL Cholesterol: 64 mg/dL (ref 0–99)
Total CHOL/HDL Ratio: 2.2 ratio
Triglycerides: 56 mg/dL (ref <150)
VLDL: 11 mg/dL (ref 0–40)

## 2012-04-18 LAB — COMPREHENSIVE METABOLIC PANEL WITH GFR
ALT: 11 U/L (ref 0–35)
AST: 15 U/L (ref 0–37)
Albumin: 4.6 g/dL (ref 3.5–5.2)
Alkaline Phosphatase: 46 U/L (ref 39–117)
BUN: 15 mg/dL (ref 6–23)
CO2: 25 meq/L (ref 19–32)
Calcium: 9.2 mg/dL (ref 8.4–10.5)
Chloride: 104 meq/L (ref 96–112)
Creat: 0.67 mg/dL (ref 0.50–1.10)
Glucose, Bld: 80 mg/dL (ref 70–99)
Potassium: 4.1 meq/L (ref 3.5–5.3)
Sodium: 138 meq/L (ref 135–145)
Total Bilirubin: 0.7 mg/dL (ref 0.3–1.2)
Total Protein: 6.9 g/dL (ref 6.0–8.3)

## 2012-04-18 NOTE — Patient Instructions (Signed)
Preventive Care for Adults, Female A healthy lifestyle and preventive care can promote health and wellness. Preventive health guidelines for women include the following key practices.  A routine yearly physical is a good way to check with your caregiver about your health and preventive screening. It is a chance to share any concerns and updates on your health, and to receive a thorough exam.   Visit your dentist for a routine exam and preventive care every 6 months. Brush your teeth twice a day and floss once a day. Good oral hygiene prevents tooth decay and gum disease.   The frequency of eye exams is based on your age, health, family medical history, use of contact lenses, and other factors. Follow your caregiver's recommendations for frequency of eye exams.   Eat a healthy diet. Foods like vegetables, fruits, whole grains, low-fat dairy products, and lean protein foods contain the nutrients you need without too many calories. Decrease your intake of foods high in solid fats, added sugars, and salt. Eat the right amount of calories for you.Get information about a proper diet from your caregiver, if necessary.   Regular physical exercise is one of the most important things you can do for your health. Most adults should get at least 150 minutes of moderate-intensity exercise (any activity that increases your heart rate and causes you to sweat) each week. In addition, most adults need muscle-strengthening exercises on 2 or more days a week.   Maintain a healthy weight. The body mass index (BMI) is a screening tool to identify possible weight problems. It provides an estimate of body fat based on height and weight. Your caregiver can help determine your BMI, and can help you achieve or maintain a healthy weight.For adults 20 years and older:   A BMI below 18.5 is considered underweight.   A BMI of 18.5 to 24.9 is normal.   A BMI of 25 to 29.9 is considered overweight.   A BMI of 30 and above is  considered obese.   Maintain normal blood lipids and cholesterol levels by exercising and minimizing your intake of saturated fat. Eat a balanced diet with plenty of fruit and vegetables. Blood tests for lipids and cholesterol should begin at age 20 and be repeated every 5 years. If your lipid or cholesterol levels are high, you are over 50, or you are at high risk for heart disease, you may need your cholesterol levels checked more frequently.Ongoing high lipid and cholesterol levels should be treated with medicines if diet and exercise are not effective.   If you smoke, find out from your caregiver how to quit. If you do not use tobacco, do not start.   If you are pregnant, do not drink alcohol. If you are breastfeeding, be very cautious about drinking alcohol. If you are not pregnant and choose to drink alcohol, do not exceed 1 drink per day. One drink is considered to be 12 ounces (355 mL) of beer, 5 ounces (148 mL) of wine, or 1.5 ounces (44 mL) of liquor.   Avoid use of street drugs. Do not share needles with anyone. Ask for help if you need support or instructions about stopping the use of drugs.   High blood pressure causes heart disease and increases the risk of stroke. Your blood pressure should be checked at least every 1 to 2 years. Ongoing high blood pressure should be treated with medicines if weight loss and exercise are not effective.   If you are 55 to 30   years old, ask your caregiver if you should take aspirin to prevent strokes.   Diabetes screening involves taking a blood sample to check your fasting blood sugar level. This should be done once every 3 years, after age 45, if you are within normal weight and without risk factors for diabetes. Testing should be considered at a younger age or be carried out more frequently if you are overweight and have at least 1 risk factor for diabetes.   Breast cancer screening is essential preventive care for women. You should practice "breast  self-awareness." This means understanding the normal appearance and feel of your breasts and may include breast self-examination. Any changes detected, no matter how small, should be reported to a caregiver. Women in their 20s and 30s should have a clinical breast exam (CBE) by a caregiver as part of a regular health exam every 1 to 3 years. After age 40, women should have a CBE every year. Starting at age 40, women should consider having a mammography (breast X-ray test) every year. Women who have a family history of breast cancer should talk to their caregiver about genetic screening. Women at a high risk of breast cancer should talk to their caregivers about having magnetic resonance imaging (MRI) and a mammography every year.   The Pap test is a screening test for cervical cancer. A Pap test can show cell changes on the cervix that might become cervical cancer if left untreated. A Pap test is a procedure in which cells are obtained and examined from the lower end of the uterus (cervix).   Women should have a Pap test starting at age 21.   Between ages 21 and 29, Pap tests should be repeated every 2 years.   Beginning at age 30, you should have a Pap test every 3 years as long as the past 3 Pap tests have been normal.   Some women have medical problems that increase the chance of getting cervical cancer. Talk to your caregiver about these problems. It is especially important to talk to your caregiver if a new problem develops soon after your last Pap test. In these cases, your caregiver may recommend more frequent screening and Pap tests.   The above recommendations are the same for women who have or have not gotten the vaccine for human papillomavirus (HPV).   If you had a hysterectomy for a problem that was not cancer or a condition that could lead to cancer, then you no longer need Pap tests. Even if you no longer need a Pap test, a regular exam is a good idea to make sure no other problems are  starting.   If you are between ages 65 and 70, and you have had normal Pap tests going back 10 years, you no longer need Pap tests. Even if you no longer need a Pap test, a regular exam is a good idea to make sure no other problems are starting.   If you have had past treatment for cervical cancer or a condition that could lead to cancer, you need Pap tests and screening for cancer for at least 20 years after your treatment.   If Pap tests have been discontinued, risk factors (such as a new sexual partner) need to be reassessed to determine if screening should be resumed.   The HPV test is an additional test that may be used for cervical cancer screening. The HPV test looks for the virus that can cause the cell changes on the cervix.   The cells collected during the Pap test can be tested for HPV. The HPV test could be used to screen women aged 30 years and older, and should be used in women of any age who have unclear Pap test results. After the age of 30, women should have HPV testing at the same frequency as a Pap test.   Colorectal cancer can be detected and often prevented. Most routine colorectal cancer screening begins at the age of 50 and continues through age 75. However, your caregiver may recommend screening at an earlier age if you have risk factors for colon cancer. On a yearly basis, your caregiver may provide home test kits to check for hidden blood in the stool. Use of a small camera at the end of a tube, to directly examine the colon (sigmoidoscopy or colonoscopy), can detect the earliest forms of colorectal cancer. Talk to your caregiver about this at age 50, when routine screening begins. Direct examination of the colon should be repeated every 5 to 10 years through age 75, unless early forms of pre-cancerous polyps or small growths are found.   Hepatitis C blood testing is recommended for all people born from 1945 through 1965 and any individual with known risks for hepatitis C.    Practice safe sex. Use condoms and avoid high-risk sexual practices to reduce the spread of sexually transmitted infections (STIs). STIs include gonorrhea, chlamydia, syphilis, trichomonas, herpes, HPV, and human immunodeficiency virus (HIV). Herpes, HIV, and HPV are viral illnesses that have no cure. They can result in disability, cancer, and death. Sexually active women aged 25 and younger should be checked for chlamydia. Older women with new or multiple partners should also be tested for chlamydia. Testing for other STIs is recommended if you are sexually active and at increased risk.   Osteoporosis is a disease in which the bones lose minerals and strength with aging. This can result in serious bone fractures. The risk of osteoporosis can be identified using a bone density scan. Women ages 65 and over and women at risk for fractures or osteoporosis should discuss screening with their caregivers. Ask your caregiver whether you should take a calcium supplement or vitamin D to reduce the rate of osteoporosis.   Menopause can be associated with physical symptoms and risks. Hormone replacement therapy is available to decrease symptoms and risks. You should talk to your caregiver about whether hormone replacement therapy is right for you.   Use sunscreen with sun protection factor (SPF) of 30 or more. Apply sunscreen liberally and repeatedly throughout the day. You should seek shade when your shadow is shorter than you. Protect yourself by wearing long sleeves, pants, a wide-brimmed hat, and sunglasses year round, whenever you are outdoors.   Once a month, do a whole body skin exam, using a mirror to look at the skin on your back. Notify your caregiver of new moles, moles that have irregular borders, moles that are larger than a pencil eraser, or moles that have changed in shape or color.   Stay current with required immunizations.   Influenza. You need a dose every fall (or winter). The composition of  the flu vaccine changes each year, so being vaccinated once is not enough.   Pneumococcal polysaccharide. You need 1 to 2 doses if you smoke cigarettes or if you have certain chronic medical conditions. You need 1 dose at age 65 (or older) if you have never been vaccinated.   Tetanus, diphtheria, pertussis (Tdap, Td). Get 1 dose of   Tdap vaccine if you are younger than age 65, are over 65 and have contact with an infant, are a healthcare worker, are pregnant, or simply want to be protected from whooping cough. After that, you need a Td booster dose every 10 years. Consult your caregiver if you have not had at least 3 tetanus and diphtheria-containing shots sometime in your life or have a deep or dirty wound.   HPV. You need this vaccine if you are a woman age 26 or younger. The vaccine is given in 3 doses over 6 months.   Measles, mumps, rubella (MMR). You need at least 1 dose of MMR if you were born in 1957 or later. You may also need a second dose.   Meningococcal. If you are age 19 to 21 and a first-year college student living in a residence hall, or have one of several medical conditions, you need to get vaccinated against meningococcal disease. You may also need additional booster doses.   Zoster (shingles). If you are age 60 or older, you should get this vaccine.   Varicella (chickenpox). If you have never had chickenpox or you were vaccinated but received only 1 dose, talk to your caregiver to find out if you need this vaccine.   Hepatitis A. You need this vaccine if you have a specific risk factor for hepatitis A virus infection or you simply wish to be protected from this disease. The vaccine is usually given as 2 doses, 6 to 18 months apart.   Hepatitis B. You need this vaccine if you have a specific risk factor for hepatitis B virus infection or you simply wish to be protected from this disease. The vaccine is given in 3 doses, usually over 6 months.  Preventive Services /  Frequency Ages 19 to 39  Blood pressure check.** / Every 1 to 2 years.   Lipid and cholesterol check.** / Every 5 years beginning at age 20.   Clinical breast exam.** / Every 3 years for women in their 20s and 30s.   Pap test.** / Every 2 years from ages 21 through 29. Every 3 years starting at age 30 through age 65 or 70 with a history of 3 consecutive normal Pap tests.   HPV screening.** / Every 3 years from ages 30 through ages 65 to 70 with a history of 3 consecutive normal Pap tests.   Hepatitis C blood test.** / For any individual with known risks for hepatitis C.   Skin self-exam. / Monthly.   Influenza immunization.** / Every year.   Pneumococcal polysaccharide immunization.** / 1 to 2 doses if you smoke cigarettes or if you have certain chronic medical conditions.   Tetanus, diphtheria, pertussis (Tdap, Td) immunization. / A one-time dose of Tdap vaccine. After that, you need a Td booster dose every 10 years.   HPV immunization. / 3 doses over 6 months, if you are 26 and younger.   Measles, mumps, rubella (MMR) immunization. / You need at least 1 dose of MMR if you were born in 1957 or later. You may also need a second dose.   Meningococcal immunization. / 1 dose if you are age 19 to 21 and a first-year college student living in a residence hall, or have one of several medical conditions, you need to get vaccinated against meningococcal disease. You may also need additional booster doses.   Varicella immunization.** / Consult your caregiver.   Hepatitis A immunization.** / Consult your caregiver. 2 doses, 6 to 18 months   apart.   Hepatitis B immunization.** / Consult your caregiver. 3 doses usually over 6 months.  Ages 40 to 64  Blood pressure check.** / Every 1 to 2 years.   Lipid and cholesterol check.** / Every 5 years beginning at age 20.   Clinical breast exam.** / Every year after age 40.   Mammogram.** / Every year beginning at age 40 and continuing for as  long as you are in good health. Consult with your caregiver.   Pap test.** / Every 3 years starting at age 30 through age 65 or 70 with a history of 3 consecutive normal Pap tests.   HPV screening.** / Every 3 years from ages 30 through ages 65 to 70 with a history of 3 consecutive normal Pap tests.   Fecal occult blood test (FOBT) of stool. / Every year beginning at age 50 and continuing until age 75. You may not need to do this test if you get a colonoscopy every 10 years.   Flexible sigmoidoscopy or colonoscopy.** / Every 5 years for a flexible sigmoidoscopy or every 10 years for a colonoscopy beginning at age 50 and continuing until age 75.   Hepatitis C blood test.** / For all people born from 1945 through 1965 and any individual with known risks for hepatitis C.   Skin self-exam. / Monthly.   Influenza immunization.** / Every year.   Pneumococcal polysaccharide immunization.** / 1 to 2 doses if you smoke cigarettes or if you have certain chronic medical conditions.   Tetanus, diphtheria, pertussis (Tdap, Td) immunization.** / A one-time dose of Tdap vaccine. After that, you need a Td booster dose every 10 years.   Measles, mumps, rubella (MMR) immunization. / You need at least 1 dose of MMR if you were born in 1957 or later. You may also need a second dose.   Varicella immunization.** / Consult your caregiver.   Meningococcal immunization.** / Consult your caregiver.   Hepatitis A immunization.** / Consult your caregiver. 2 doses, 6 to 18 months apart.   Hepatitis B immunization.** / Consult your caregiver. 3 doses, usually over 6 months.  Ages 65 and over  Blood pressure check.** / Every 1 to 2 years.   Lipid and cholesterol check.** / Every 5 years beginning at age 20.   Clinical breast exam.** / Every year after age 40.   Mammogram.** / Every year beginning at age 40 and continuing for as long as you are in good health. Consult with your caregiver.   Pap test.** /  Every 3 years starting at age 30 through age 65 or 70 with a 3 consecutive normal Pap tests. Testing can be stopped between 65 and 70 with 3 consecutive normal Pap tests and no abnormal Pap or HPV tests in the past 10 years.   HPV screening.** / Every 3 years from ages 30 through ages 65 or 70 with a history of 3 consecutive normal Pap tests. Testing can be stopped between 65 and 70 with 3 consecutive normal Pap tests and no abnormal Pap or HPV tests in the past 10 years.   Fecal occult blood test (FOBT) of stool. / Every year beginning at age 50 and continuing until age 75. You may not need to do this test if you get a colonoscopy every 10 years.   Flexible sigmoidoscopy or colonoscopy.** / Every 5 years for a flexible sigmoidoscopy or every 10 years for a colonoscopy beginning at age 50 and continuing until age 75.   Hepatitis   C blood test.** / For all people born from 1945 through 1965 and any individual with known risks for hepatitis C.   Osteoporosis screening.** / A one-time screening for women ages 65 and over and women at risk for fractures or osteoporosis.   Skin self-exam. / Monthly.   Influenza immunization.** / Every year.   Pneumococcal polysaccharide immunization.** / 1 dose at age 65 (or older) if you have never been vaccinated.   Tetanus, diphtheria, pertussis (Tdap, Td) immunization. / A one-time dose of Tdap vaccine if you are over 65 and have contact with an infant, are a healthcare worker, or simply want to be protected from whooping cough. After that, you need a Td booster dose every 10 years.   Varicella immunization.** / Consult your caregiver.   Meningococcal immunization.** / Consult your caregiver.   Hepatitis A immunization.** / Consult your caregiver. 2 doses, 6 to 18 months apart.   Hepatitis B immunization.** / Check with your caregiver. 3 doses, usually over 6 months.  ** Family history and personal history of risk and conditions may change your caregiver's  recommendations. Document Released: 01/03/2002 Document Revised: 10/27/2011 Document Reviewed: 04/04/2011 [employer] Patient Information 2012 [employer]. 

## 2012-04-18 NOTE — Progress Notes (Signed)
  Subjective:     [name] is a 31 y.o. female and is here for a comprehensive physical exam. The patient reports no problems.  History   Social History  . Marital Status: Married    Spouse Name: N/A    Number of Children: N/A  . Years of Education: N/A   Occupational History  . Not on file.   Social History Main Topics  . Smoking status: Never Smoker   . Smokeless tobacco: Not on file  . Alcohol Use: Yes     occasion  . Drug Use: No  . Sexually Active: Yes -- Female partner(s)    Birth Control/ Protection: IUD   Other Topics Concern  . Not on file   Social History Narrative  . No narrative on file   Health Maintenance  Topic Date Due  . Pap Smear  11/30/1999  . Tetanus/tdap  11/29/2000  . Influenza Vaccine  08/21/2012    The following portions of the patient's history were reviewed and updated as appropriate: allergies, current medications, past family history, past medical history, past social history, past surgical history and problem list.  Review of Systems A comprehensive review of systems was negative.   Objective:    BP 94/62  Pulse 70  Ht 5\' 7"  (1.702 m)  Wt 145 lb (65.772 kg)  BMI 22.71 kg/m2 General appearance: alert, cooperative and appears stated age Head: Normocephalic, without obvious abnormality, atraumatic Neck: no adenopathy, supple, symmetrical, trachea midline and thyroid not enlarged, symmetric, no tenderness/mass/nodules Lungs: clear to auscultation bilaterally Breasts: normal appearance, no masses or tenderness Heart: regular rate and rhythm, S1, S2 normal, no murmur, click, rub or gallop Abdomen: soft, non-tender; bowel sounds normal; no masses,  no organomegaly Pelvic: cervix normal in appearance, external genitalia normal, no adnexal masses or tenderness, no cervical motion tenderness, uterus normal size, shape, and consistency and vagina normal without discharge Extremities: extremities normal, atraumatic, no cyanosis or edema Pulses:  2+ and symmetric Skin: Skin color, texture, turgor normal. No rashes or lesions Lymph nodes: Cervical, supraclavicular, and axillary nodes normal. Neurologic: Grossly normal    Assessment:    Healthy female exam.      Plan:    Pap smear, continue IUD See After Visit Summary for Counseling Recommendations

## 2012-05-30 DIAGNOSIS — G40919 Epilepsy, unspecified, intractable, without status epilepticus: Secondary | ICD-10-CM | POA: Insufficient documentation

## 2012-11-21 HISTORY — PX: COLONOSCOPY: SHX174

## 2013-04-22 ENCOUNTER — Ambulatory Visit (INDEPENDENT_AMBULATORY_CARE_PROVIDER_SITE_OTHER): Payer: BC Managed Care – PPO | Admitting: Obstetrics & Gynecology

## 2013-04-22 ENCOUNTER — Encounter: Payer: Self-pay | Admitting: Obstetrics & Gynecology

## 2013-04-22 VITALS — BP 117/65 | HR 63 | Ht 67.0 in | Wt 155.0 lb

## 2013-04-22 DIAGNOSIS — Z01419 Encounter for gynecological examination (general) (routine) without abnormal findings: Secondary | ICD-10-CM

## 2013-04-22 DIAGNOSIS — Z1151 Encounter for screening for human papillomavirus (HPV): Secondary | ICD-10-CM

## 2013-04-22 DIAGNOSIS — Z124 Encounter for screening for malignant neoplasm of cervix: Secondary | ICD-10-CM

## 2013-04-22 DIAGNOSIS — Z0000 Encounter for general adult medical examination without abnormal findings: Secondary | ICD-10-CM

## 2013-04-22 NOTE — Progress Notes (Signed)
Here today for year gyn physical, no complaints.

## 2013-04-22 NOTE — Progress Notes (Signed)
Subjective:    [name] is a 32 y.o. female who presents for an annual exam. The patient has no complaints today.  The patient is sexually active. GYN screening history: last pap: was normal. The patient wears seatbelts: yes. The patient participates in regular exercise: no. Has the patient ever been transfused or tattooed?: yes.(tattoos) The patient reports that there is not domestic violence in her life.   Menstrual History: OB History   Grav Para Term Preterm Abortions TAB SAB Ect Mult Living   2 2        2       Menarche age: 85  No LMP recorded. Patient is not currently having periods (Reason: IUD).    The following portions of the patient's history were reviewed and updated as appropriate: allergies, current medications, past family history, past medical history, past social history, past surgical history and problem list.  Review of Systems A comprehensive review of systems was negative. Married for 9 years, denies dyspareunia. Uses Mirena, due to be replaced next year. Amenorrheic. 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   Objective:    BP 117/65  Pulse 63  Ht 5\' 7"  (1.702 m)  Wt 155 lb (70.308 kg)  BMI 24.27 kg/m2  General Appearance:    Alert, cooperative, no distress, appears stated age  Head:    Normocephalic, without obvious abnormality, atraumatic  Eyes:    PERRL, conjunctiva/corneas clear, EOM's intact, fundi    benign, both eyes  Ears:    Normal TM's and external ear canals, both ears  Nose:   Nares normal, septum midline, mucosa normal, no drainage    or sinus tenderness  Throat:   Lips, mucosa, and tongue normal; teeth and gums normal  Neck:   Supple, symmetrical, trachea midline, no adenopathy;    thyroid:  no enlargement/tenderness/nodules; no carotid   bruit or JVD  Back:     Symmetric, no curvature, ROM normal, no CVA tenderness  Lungs:     Clear to auscultation bilaterally, respirations unlabored  Chest Wall:    No tenderness or deformity   Heart:    Regular rate and rhythm, S1 and S2 normal, no murmur, rub   or gallop  Breast Exam:    No tenderness, masses, or nipple abnormality  Abdomen:     Soft, non-tender, bowel sounds active all four quadrants,    no masses, no organomegaly  Genitalia:    Normal female without lesion, discharge or tenderness, NSSA, NT, mobile, normal adnexa     Extremities:   Extremities normal, atraumatic, no cyanosis or edema  Pulses:   2+ and symmetric all extremities  Skin:   Skin color, texture, turgor normal, no rashes or lesions  Lymph nodes:   Cervical, supraclavicular, and axillary nodes normal  Neurologic:   CNII-XII intact, normal strength, sensation and reflexes    throughout  .    Assessment:    Healthy female exam.    Plan:     Thin prep Pap smear.  Rec SBE/SVE

## 2013-09-24 ENCOUNTER — Encounter: Payer: Self-pay | Admitting: Gastroenterology

## 2013-10-02 ENCOUNTER — Ambulatory Visit (INDEPENDENT_AMBULATORY_CARE_PROVIDER_SITE_OTHER): Payer: BC Managed Care – PPO | Admitting: Gastroenterology

## 2013-10-02 ENCOUNTER — Encounter: Payer: Self-pay | Admitting: Gastroenterology

## 2013-10-02 VITALS — BP 90/60 | HR 68 | Ht 67.0 in | Wt 159.6 lb

## 2013-10-02 DIAGNOSIS — K625 Hemorrhage of anus and rectum: Secondary | ICD-10-CM

## 2013-10-02 MED ORDER — NA SULFATE-K SULFATE-MG SULF 17.5-3.13-1.6 GM/177ML PO SOLN: 1.0000 | Freq: Once | ORAL | Status: DC

## 2013-10-02 NOTE — Patient Instructions (Signed)

## 2013-10-02 NOTE — Progress Notes (Signed)
History of Present Illness: 32 year old white female referred for evaluation of rectal bleeding.  On 2 occasions she has seen bright red blood mixed with her stools, in the water and on the tissue.  There is no history of change in bowel habits, abdominal or rectal pain.  She's had limited rectal bleeding in the past following a C-section.  She has no antecedent GI complaints.    Past Medical History  Diagnosis Date  . Seizures   . Epilepsy   . Allergy     seasonal  . Chronic headaches    Past Surgical History  Procedure Laterality Date  . Irrigation and debridement sebaceous cyst  1985  . Wisdom tooth extraction  2001  . Cesarean section      x2   family history includes Lung cancer in her maternal grandfather. Current Outpatient Prescriptions  Medication Sig Dispense Refill  . Fexofenadine HCl (ALLEGRA PO) Take by mouth.      . levETIRAcetam (KEPPRA) 750 MG tablet Take 2,250 mg by mouth 1 day or 1 dose.       . levonorgestrel (MIRENA) 20 MCG/24HR IUD 1 each by Intrauterine route once.       No current facility-administered medications for this visit.   Allergies as of 10/02/2013 - Review Complete 10/02/2013  Allergen Reaction Noted  . Tegretol [carbamazepine] Hives 01/26/2012    reports that she has never smoked. She has never used smokeless tobacco. She reports that she drinks alcohol. She reports that she does not use illicit drugs.     Review of Systems: She suffers from migraine headaches Pertinent positive and negative review of systems were noted in the above HPI section. All other review of systems were otherwise negative.  Vital signs were reviewed in today's medical record Physical Exam: General: Well developed , well nourished, no acute distress Skin: anicteric Head: Normocephalic and atraumatic Eyes:  sclerae anicteric, EOMI Ears: Normal auditory acuity Mouth: No deformity or lesions Neck: Supple, no masses or thyromegaly Lungs: Clear throughout to  auscultation Heart: Regular rate and rhythm; no murmurs, rubs or bruits Abdomen: Soft, non tender and non distended. No masses, hepatosplenomegaly or hernias noted. Normal Bowel sounds Rectal: There are no external lesions Musculoskeletal: Symmetrical with no gross deformities  Skin: No lesions on visible extremities Pulses:  Normal pulses noted Extremities: No clubbing, cyanosis, edema or deformities noted Neurological: Alert oriented x 4, grossly nonfocal Cervical Nodes:  No significant cervical adenopathy Inguinal Nodes: No significant inguinal adenopathy Psychological:  Alert and cooperative. Normal mood and affect

## 2013-10-02 NOTE — Assessment & Plan Note (Signed)
Limited rectal bleeding is most likely due to 2 internal hemorrhoids.  Other causes for bleeding including polyps, less likely AVMs and neoplasm should be excluded.  Recommendations #1 colonoscopy

## 2013-10-03 ENCOUNTER — Encounter: Payer: Self-pay | Admitting: Gastroenterology

## 2013-10-23 ENCOUNTER — Ambulatory Visit (AMBULATORY_SURGERY_CENTER): Payer: BC Managed Care – PPO | Admitting: Gastroenterology

## 2013-10-23 ENCOUNTER — Encounter: Payer: Self-pay | Admitting: Gastroenterology

## 2013-10-23 VITALS — BP 95/62 | HR 56 | Temp 96.9°F | Resp 20 | Ht 67.0 in | Wt 159.0 lb

## 2013-10-23 DIAGNOSIS — K625 Hemorrhage of anus and rectum: Secondary | ICD-10-CM

## 2013-10-23 DIAGNOSIS — K648 Other hemorrhoids: Secondary | ICD-10-CM

## 2013-10-23 DIAGNOSIS — D126 Benign neoplasm of colon, unspecified: Secondary | ICD-10-CM

## 2013-10-23 MED ORDER — SODIUM CHLORIDE 0.9 % IV SOLN: 500.0000 mL | INTRAVENOUS | Status: DC

## 2013-10-23 NOTE — Progress Notes (Signed)
Patient did not experience any of the following events: a burn prior to discharge; a fall within the facility; wrong site/side/patient/procedure/implant event; or a hospital transfer or hospital admission upon discharge from the facility. (G8907) Patient did not have preoperative order for IV antibiotic SSI prophylaxis. (G8918)  

## 2013-10-23 NOTE — Progress Notes (Signed)
Procedure ens, to recovery, report given and VSS  

## 2013-10-23 NOTE — Patient Instructions (Signed)
Discharge instructions given with verbal understanding. Handout on polyp and hemorrhoids. Resume previous medications. YOU HAD AN ENDOSCOPIC PROCEDURE TODAY AT THE [facility]: Refer to the procedure report that was given to you for any specific questions about what was found during the examination.  If the procedure report does not answer your questions, please call your gastroenterologist to clarify.  If you requested that your care partner not be given the details of your procedure findings, then the procedure report has been included in a sealed envelope for you to review at your convenience later.  YOU SHOULD EXPECT: Some feelings of bloating in the abdomen. Passage of more gas than usual.  Walking can help get rid of the air that was put into your GI tract during the procedure and reduce the bloating. If you had a lower endoscopy (such as a colonoscopy or flexible sigmoidoscopy) you may notice spotting of blood in your stool or on the toilet paper. If you underwent a bowel prep for your procedure, then you may not have a normal bowel movement for a few days.  DIET: Your first meal following the procedure should be a light meal and then it is ok to progress to your normal diet.  A half-sandwich or bowl of soup is an example of a good first meal.  Heavy or fried foods are harder to digest and may make you feel nauseous or bloated.  Likewise meals heavy in dairy and vegetables can cause extra gas to form and this can also increase the bloating.  Drink plenty of fluids but you should avoid alcoholic beverages for 24 hours.  ACTIVITY: Your care partner should take you home directly after the procedure.  You should plan to take it easy, moving slowly for the rest of the day.  You can resume normal activity the day after the procedure however you should NOT DRIVE or use heavy machinery for 24 hours (because of the sedation medicines used during the test).    SYMPTOMS TO REPORT IMMEDIATELY: A  gastroenterologist can be reached at any hour.  During normal business hours, 8:30 AM to 5:00 PM Monday through Friday, call [phone].  After hours and on weekends, please call the GI answering service at [phone] who will take a message and have the physician on call contact you.   Following lower endoscopy (colonoscopy or flexible sigmoidoscopy):  Excessive amounts of blood in the stool  Significant tenderness or worsening of abdominal pains  Swelling of the abdomen that is new, acute  Fever of 100F or higher  FOLLOW UP: If any biopsies were taken you will be contacted by phone or by letter within the next 1-3 weeks.  Call your gastroenterologist if you have not heard about the biopsies in 3 weeks.  Our staff will call the home number listed on your records the next business day following your procedure to check on you and address any questions or concerns that you may have at that time regarding the information given to you following your procedure. This is a courtesy call and so if there is no answer at the home number and we have not heard from you through the emergency physician on call, we will assume that you have returned to your regular daily activities without incident.  SIGNATURES/CONFIDENTIALITY: You and/or your care partner have signed paperwork which will be entered into your electronic medical record.  These signatures attest to the fact that that the information above on your After Visit Summary  has been reviewed and is understood.  Full responsibility of the confidentiality of this discharge information lies with you and/or your care-partner. 

## 2013-10-23 NOTE — Progress Notes (Signed)
Called to room to assist during endoscopic procedure.  Patient ID and intended procedure confirmed with present staff. Received instructions for my participation in the procedure from the performing physician.  

## 2013-10-23 NOTE — Op Note (Signed)
[facility] [address] [place], [address]   COLONOSCOPY PROCEDURE REPORT  PATIENT: [name], [name]  MR#: [id number] BIRTHDATE: 03-07-1981 , 32  yrs. old GENDER: Female ENDOSCOPIST: Louis Meckel, MD REFERRED BY: PROCEDURE DATE:  10/23/2013 PROCEDURE:   Colonoscopy with snare polypectomy and Submucosal injection, any substance First Screening Colonoscopy - Avg.  risk and is 50 yrs.  old or older Yes.  Prior Negative Screening - Now for repeat screening. N/A  History of Adenoma - Now for follow-up colonoscopy & has been > or = to 3 yrs.  N/A  Polyps Removed Today? Yes. ASA CLASS:   Class II INDICATIONS:Rectal Bleeding. MEDICATIONS: MAC sedation, administered by CRNA and Propofol (Diprivan) 440 mg IV  DESCRIPTION OF PROCEDURE:   After the risks benefits and alternatives of the procedure were thoroughly explained, informed consent was obtained.  A digital rectal exam revealed no abnormalities of the rectum.   The LB [id number]  endoscope was introduced through the anus and advanced to the cecum, which was identified by both the appendix and ileocecal valve. No adverse events experienced.   The quality of the prep was excellent using Suprep  The instrument was then slowly withdrawn as the colon was fully examined.      COLON FINDINGS: A sessile polyp measuring 15 mm in size was found in the ascending colon.  A polypectomy was performed using snare cautery.  The resection was complete and the polyp tissue was completely retrieved.  A saline Injection 5cc  was given to lift the mucosal wall. There was slight oozing from the polypectomy base.  Because of the size of the defect from polypectomy a single endoscopic clip was applied.there was no further bleeding. Retroflexed views revealed internal hemorrhoids. The time to cecum=7 minutes 53 seconds.  Withdrawal time=12 minutes 26 seconds. The scope was withdrawn and the procedure  completed. COMPLICATIONS: There were no complications.  ENDOSCOPIC IMPRESSION: 1.  Polyp ascending colon 2.  Internal hemorrhoids - source for rectal bleeding  RECOMMENDATIONS: 1.  If the polyp(s) removed today are proven to be adenomatous (pre-cancerous) polyps, you will need a colonoscopy in 3 years. Otherwise you should continue to follow colorectal cancer screening guidelines for "routine risk" patients with a colonoscopy in 10 years.  You will receive a letter within 1-2 weeks with the results of your biopsy as well as final recommendations.  Please call my office if you have not received a letter after 3 weeks. 2.  hemorrhoidal suppositories for recurrent bleeding   eSigned:  Louis Meckel, MD 10/23/2013 10:18 AM   cc: Jonette Eva, MD   PATIENT NAME:  [name], [name] MR#: [id number]

## 2013-10-24 ENCOUNTER — Telehealth: Payer: Self-pay | Admitting: *Deleted

## 2013-10-24 NOTE — Telephone Encounter (Signed)
Name identifier, left message, follow-up 

## 2013-10-29 ENCOUNTER — Encounter: Payer: Self-pay | Admitting: Gastroenterology

## 2014-02-25 ENCOUNTER — Ambulatory Visit (INDEPENDENT_AMBULATORY_CARE_PROVIDER_SITE_OTHER): Payer: BC Managed Care – PPO | Admitting: Family Medicine

## 2014-02-25 ENCOUNTER — Encounter: Payer: Self-pay | Admitting: Family Medicine

## 2014-02-25 VITALS — BP 126/80 | HR 64 | Ht 67.0 in | Wt 160.0 lb

## 2014-02-25 DIAGNOSIS — Z30433 Encounter for removal and reinsertion of intrauterine contraceptive device: Secondary | ICD-10-CM

## 2014-02-25 NOTE — Progress Notes (Signed)
    Subjective: Patient here for IUD change.  Has been in for 5 years and due to be changed. She has no cycles and desires to continue Mirena IUD for contraception.  Objective: Filed Vitals:   02/25/14 1536  BP: 126/80  Pulse: 64    Patient identified, informed consent performed, signed copy in chart, time out was performed Procedure: Speculum placed inside vagina.  Cervix visualized. Strings were not visualized and gentle cervical dilation performed until IUD strings grasped with ring forceps.  IUD removed intact.  Procedure: Cervix visualized.  Cleaned with Betadine x 2.  Grasped anteriourly with a single tooth tenaculum.  Uterus sounded to 6.5 cm.  Mirena IUD placed per manufacturer's recommendations.  Strings trimmed to 3 cm.   Patient given post procedure instructions and Mirena care card with expiration date.  Patient is asked to check IUD strings periodically and follow up in 4-6 weeks for IUD check.  Impression: Encounter for IUD removal and reinsertion   Plan: Continue IUD for 5 years.

## 2014-02-25 NOTE — Patient Instructions (Signed)
Levonorgestrel intrauterine device (IUD) What is this medicine? LEVONORGESTREL IUD (LEE voe nor jes trel) is a contraceptive (birth control) device. The device is placed inside the uterus by a healthcare professional. It is used to prevent pregnancy and can also be used to treat heavy bleeding that occurs during your period. Depending on the device, it can be used for 3 to 5 years. This medicine may be used for other purposes; ask your health care provider or pharmacist if you have questions. COMMON BRAND NAME(S): Mirena, Skyla What should I tell my health care provider before I take this medicine? They need to know if you have any of these conditions: -abnormal Pap smear -cancer of the breast, uterus, or cervix -diabetes -endometritis -genital or pelvic infection now or in the past -have more than one sexual partner or your partner has more than one partner -heart disease -history of an ectopic or tubal pregnancy -immune system problems -IUD in place -liver disease or tumor -problems with blood clots or take blood-thinners -use intravenous drugs -uterus of unusual shape -vaginal bleeding that has not been explained -an unusual or allergic reaction to levonorgestrel, other hormones, silicone, or polyethylene, medicines, foods, dyes, or preservatives -pregnant or trying to get pregnant -breast-feeding How should I use this medicine? This device is placed inside the uterus by a health care professional. Talk to your pediatrician regarding the use of this medicine in children. Special care may be needed. Overdosage: If you think you have taken too much of this medicine contact a poison control center or emergency room at once. NOTE: This medicine is only for you. Do not share this medicine with others. What if I miss a dose? This does not apply. What may interact with this medicine? Do not take this medicine with any of the following  medications: -amprenavir -bosentan -fosamprenavir This medicine may also interact with the following medications: -aprepitant -barbiturate medicines for inducing sleep or treating seizures -bexarotene -griseofulvin -medicines to treat seizures like carbamazepine, ethotoin, felbamate, oxcarbazepine, phenytoin, topiramate -modafinil -pioglitazone -rifabutin -rifampin -rifapentine -some medicines to treat HIV infection like atazanavir, indinavir, lopinavir, nelfinavir, tipranavir, ritonavir -St. John's wort -warfarin This list may not describe all possible interactions. Give your health care provider a list of all the medicines, herbs, non-prescription drugs, or dietary supplements you use. Also tell them if you smoke, drink alcohol, or use illegal drugs. Some items may interact with your medicine. What should I watch for while using this medicine? Visit your doctor or health care professional for regular check ups. See your doctor if you or your partner has sexual contact with others, becomes HIV positive, or gets a sexual transmitted disease. This product does not protect you against HIV infection (AIDS) or other sexually transmitted diseases. You can check the placement of the IUD yourself by reaching up to the top of your vagina with clean fingers to feel the threads. Do not pull on the threads. It is a good habit to check placement after each menstrual period. Call your doctor right away if you feel more of the IUD than just the threads or if you cannot feel the threads at all. The IUD may come out by itself. You may become pregnant if the device comes out. If you notice that the IUD has come out use a backup birth control method like condoms and call your health care provider. Using tampons will not change the position of the IUD and are okay to use during your period. What side effects may I   notice from receiving this medicine? Side effects that you should report to your doctor or  health care professional as soon as possible: -allergic reactions like skin rash, itching or hives, swelling of the face, lips, or tongue -fever, flu-like symptoms -genital sores -high blood pressure -no menstrual period for 6 weeks during use -pain, swelling, warmth in the leg -pelvic pain or tenderness -severe or sudden headache -signs of pregnancy -stomach cramping -sudden shortness of breath -trouble with balance, talking, or walking -unusual vaginal bleeding, discharge -yellowing of the eyes or skin Side effects that usually do not require medical attention (report to your doctor or health care professional if they continue or are bothersome): -acne -breast pain -change in sex drive or performance -changes in weight -cramping, dizziness, or faintness while the device is being inserted -headache -irregular menstrual bleeding within first 3 to 6 months of use -nausea This list may not describe all possible side effects. Call your doctor for medical advice about side effects. You may report side effects to FDA at 1-800-FDA-1088. Where should I keep my medicine? This does not apply. NOTE: This sheet is a summary. It may not cover all possible information. If you have questions about this medicine, talk to your doctor, pharmacist, or health care provider.  2014, Elsevier/Gold Standard. (2011-12-08 13:54:04)  

## 2014-04-22 ENCOUNTER — Ambulatory Visit: Payer: BC Managed Care – PPO | Admitting: Family Medicine

## 2014-09-22 ENCOUNTER — Encounter: Payer: Self-pay | Admitting: Family Medicine

## 2015-09-29 ENCOUNTER — Encounter: Payer: Self-pay | Admitting: *Deleted

## 2015-11-22 HISTORY — PX: POLYPECTOMY: SHX149

## 2015-11-22 HISTORY — PX: COLONOSCOPY: SHX174

## 2016-07-06 ENCOUNTER — Ambulatory Visit (INDEPENDENT_AMBULATORY_CARE_PROVIDER_SITE_OTHER): Payer: BC Managed Care – PPO | Admitting: Family Medicine

## 2016-07-06 ENCOUNTER — Encounter: Payer: Self-pay | Admitting: *Deleted

## 2016-07-06 ENCOUNTER — Encounter: Payer: Self-pay | Admitting: Family Medicine

## 2016-07-06 VITALS — BP 124/88 | Resp 18 | Ht 67.0 in | Wt 154.0 lb

## 2016-07-06 DIAGNOSIS — Z1151 Encounter for screening for human papillomavirus (HPV): Secondary | ICD-10-CM | POA: Diagnosis not present

## 2016-07-06 DIAGNOSIS — Z124 Encounter for screening for malignant neoplasm of cervix: Secondary | ICD-10-CM

## 2016-07-06 DIAGNOSIS — Z01419 Encounter for gynecological examination (general) (routine) without abnormal findings: Secondary | ICD-10-CM | POA: Diagnosis not present

## 2016-07-06 DIAGNOSIS — K625 Hemorrhage of anus and rectum: Secondary | ICD-10-CM | POA: Diagnosis not present

## 2016-07-06 NOTE — Progress Notes (Signed)
   Subjective:     [name] is a 35 y.o. female and is here for a comprehensive physical exam. The patient reports no problems. Has no cycle with IUD. She is a Educational psychologist at [employer].  Social History   Social History  . Marital status: Married    Spouse name: N/A  . Number of children: 2  . Years of education: N/A   Occupational History  . [employer]   Social History Main Topics  . Smoking status: Never Smoker  . Smokeless tobacco: Never Used  . Alcohol use Yes     Comment: occasion  . Drug use: No  . Sexual activity: Yes    Partners: Male    Birth control/ protection: IUD   Other Topics Concern  . Not on file   Social History Narrative  . No narrative on file   Health Maintenance  Topic Date Due  . TETANUS/TDAP  11/29/2000  . PAP SMEAR  04/22/2016  . INFLUENZA VACCINE  06/21/2016  . COLONOSCOPY  10/23/2016  . HIV Screening  Completed    The following portions of the patient's history were reviewed and updated as appropriate: allergies, current medications, past family history, past medical history, past social history, past surgical history and problem list.  Review of Systems Pertinent items noted in HPI and remainder of comprehensive ROS otherwise negative.   Objective:    BP 124/88 (BP Location: Left Arm, Patient Position: Sitting, Cuff Size: Normal)   Resp 18   Ht 5\' 7"  (1.702 m)   Wt 154 lb (69.9 kg)   BMI 24.12 kg/m  General appearance: alert, cooperative and appears stated age Head: Normocephalic, without obvious abnormality, atraumatic Neck: no adenopathy, supple, symmetrical, trachea midline and thyroid not enlarged, symmetric, no tenderness/mass/nodules Lungs: clear to auscultation bilaterally Breasts: normal appearance, no masses or tenderness Heart: regular rate and rhythm, S1, S2 normal, no murmur, click, rub or gallop Abdomen: soft, non-tender; bowel sounds normal; no masses,  no organomegaly Pelvic: cervix normal in  appearance, external genitalia normal, no adnexal masses or tenderness, no cervical motion tenderness, uterus normal size, shape, and consistency, vagina normal without discharge and IUD strings visualized Extremities: extremities normal, atraumatic, no cyanosis or edema Pulses: 2+ and symmetric Skin: Skin color, texture, turgor normal. No rashes or lesions Lymph nodes: Cervical, supraclavicular, and axillary nodes normal. Neurologic: Grossly normal    Assessment:    Healthy female exam.      Plan:   Problem List Items Addressed This Visit      Unprioritized   Hemorrhage of rectum and anus    Other Visit Diagnoses    Encounter for routine gynecological examination    -  Primary   Screening for cervical cancer       Relevant Orders   Cytology - PAP     Return for fasting labs. Continue IUD See After Visit Summary for Counseling Recommendations

## 2016-07-06 NOTE — Patient Instructions (Signed)
Preventive Care for Adults, Female A healthy lifestyle and preventive care can promote health and wellness. Preventive health guidelines for women include the following key practices.  A routine yearly physical is a good way to check with your health care provider about your health and preventive screening. It is a chance to share any concerns and updates on your health and to receive a thorough exam.  Visit your dentist for a routine exam and preventive care every 6 months. Brush your teeth twice a day and floss once a day. Good oral hygiene prevents tooth decay and gum disease.  The frequency of eye exams is based on your age, health, family medical history, use of contact lenses, and other factors. Follow your health care provider's recommendations for frequency of eye exams.  Eat a healthy diet. Foods like vegetables, fruits, whole grains, low-fat dairy products, and lean protein foods contain the nutrients you need without too many calories. Decrease your intake of foods high in solid fats, added sugars, and salt. Eat the right amount of calories for you.Get information about a proper diet from your health care provider, if necessary.  Regular physical exercise is one of the most important things you can do for your health. Most adults should get at least 150 minutes of moderate-intensity exercise (any activity that increases your heart rate and causes you to sweat) each week. In addition, most adults need muscle-strengthening exercises on 2 or more days a week.  Maintain a healthy weight. The body mass index (BMI) is a screening tool to identify possible weight problems. It provides an estimate of body fat based on height and weight. Your health care provider can find your BMI and can help you achieve or maintain a healthy weight.For adults 20 years and older:  A BMI below 18.5 is considered underweight.  A BMI of 18.5 to 24.9 is normal.  A BMI of 25 to 29.9 is considered overweight.  A  BMI of 30 and above is considered obese.  Maintain normal blood lipids and cholesterol levels by exercising and minimizing your intake of saturated fat. Eat a balanced diet with plenty of fruit and vegetables. Blood tests for lipids and cholesterol should begin at age 45 and be repeated every 5 years. If your lipid or cholesterol levels are high, you are over 50, or you are at high risk for heart disease, you may need your cholesterol levels checked more frequently.Ongoing high lipid and cholesterol levels should be treated with medicines if diet and exercise are not working.  If you smoke, find out from your health care provider how to quit. If you do not use tobacco, do not start.  Lung cancer screening is recommended for adults aged 45-80 years who are at high risk for developing lung cancer because of a history of smoking. A yearly low-dose CT scan of the lungs is recommended for people who have at least a 30-pack-year history of smoking and are a current smoker or have quit within the past 15 years. A pack year of smoking is smoking an average of 1 pack of cigarettes a day for 1 year (for example: 1 pack a day for 30 years or 2 packs a day for 15 years). Yearly screening should continue until the smoker has stopped smoking for at least 15 years. Yearly screening should be stopped for people who develop a health problem that would prevent them from having lung cancer treatment.  If you are pregnant, do not drink alcohol. If you are  breastfeeding, be very cautious about drinking alcohol. If you are not pregnant and choose to drink alcohol, do not have more than 1 drink per day. One drink is considered to be 12 ounces (355 mL) of beer, 5 ounces (148 mL) of wine, or 1.5 ounces (44 mL) of liquor.  Avoid use of street drugs. Do not share needles with anyone. Ask for help if you need support or instructions about stopping the use of drugs.  High blood pressure causes heart disease and increases the risk  of stroke. Your blood pressure should be checked at least every 1 to 2 years. Ongoing high blood pressure should be treated with medicines if weight loss and exercise do not work.  If you are 55-79 years old, ask your health care provider if you should take aspirin to prevent strokes.  Diabetes screening is done by taking a blood sample to check your blood glucose level after you have not eaten for a certain period of time (fasting). If you are not overweight and you do not have risk factors for diabetes, you should be screened once every 3 years starting at age 45. If you are overweight or obese and you are 40-70 years of age, you should be screened for diabetes every year as part of your cardiovascular risk assessment.  Breast cancer screening is essential preventive care for women. You should practice "breast self-awareness." This means understanding the normal appearance and feel of your breasts and may include breast self-examination. Any changes detected, no matter how small, should be reported to a health care provider. Women in their 20s and 30s should have a clinical breast exam (CBE) by a health care provider as part of a regular health exam every 1 to 3 years. After age 40, women should have a CBE every year. Starting at age 40, women should consider having a mammogram (breast X-ray test) every year. Women who have a family history of breast cancer should talk to their health care provider about genetic screening. Women at a high risk of breast cancer should talk to their health care providers about having an MRI and a mammogram every year.  Breast cancer gene (BRCA)-related cancer risk assessment is recommended for women who have family members with BRCA-related cancers. BRCA-related cancers include breast, ovarian, tubal, and peritoneal cancers. Having family members with these cancers may be associated with an increased risk for harmful changes (mutations) in the breast cancer genes BRCA1 and  BRCA2. Results of the assessment will determine the need for genetic counseling and BRCA1 and BRCA2 testing.  Your health care provider may recommend that you be screened regularly for cancer of the pelvic organs (ovaries, uterus, and vagina). This screening involves a pelvic examination, including checking for microscopic changes to the surface of your cervix (Pap test). You may be encouraged to have this screening done every 3 years, beginning at age 21.  For women ages 30-65, health care providers may recommend pelvic exams and Pap testing every 3 years, or they may recommend the Pap and pelvic exam, combined with testing for human papilloma virus (HPV), every 5 years. Some types of HPV increase your risk of cervical cancer. Testing for HPV may also be done on women of any age with unclear Pap test results.  Other health care providers may not recommend any screening for nonpregnant women who are considered low risk for pelvic cancer and who do not have symptoms. Ask your health care provider if a screening pelvic exam is right for   you.  If you have had past treatment for cervical cancer or a condition that could lead to cancer, you need Pap tests and screening for cancer for at least 20 years after your treatment. If Pap tests have been discontinued, your risk factors (such as having a new sexual partner) need to be reassessed to determine if screening should resume. Some women have medical problems that increase the chance of getting cervical cancer. In these cases, your health care provider may recommend more frequent screening and Pap tests.  Colorectal cancer can be detected and often prevented. Most routine colorectal cancer screening begins at the age of 50 years and continues through age 75 years. However, your health care provider may recommend screening at an earlier age if you have risk factors for colon cancer. On a yearly basis, your health care provider may provide home test kits to check  for hidden blood in the stool. Use of a small camera at the end of a tube, to directly examine the colon (sigmoidoscopy or colonoscopy), can detect the earliest forms of colorectal cancer. Talk to your health care provider about this at age 50, when routine screening begins. Direct exam of the colon should be repeated every 5-10 years through age 75 years, unless early forms of precancerous polyps or small growths are found.  People who are at an increased risk for hepatitis B should be screened for this virus. You are considered at high risk for hepatitis B if:  You were born in a country where hepatitis B occurs often. Talk with your health care provider about which countries are considered high risk.  Your parents were born in a high-risk country and you have not received a shot to protect against hepatitis B (hepatitis B vaccine).  You have HIV or AIDS.  You use needles to inject street drugs.  You live with, or have sex with, someone who has hepatitis B.  You get hemodialysis treatment.  You take certain medicines for conditions like cancer, organ transplantation, and autoimmune conditions.  Hepatitis C blood testing is recommended for all people born from 1945 through 1965 and any individual with known risks for hepatitis C.  Practice safe sex. Use condoms and avoid high-risk sexual practices to reduce the spread of sexually transmitted infections (STIs). STIs include gonorrhea, chlamydia, syphilis, trichomonas, herpes, HPV, and human immunodeficiency virus (HIV). Herpes, HIV, and HPV are viral illnesses that have no cure. They can result in disability, cancer, and death.  You should be screened for sexually transmitted illnesses (STIs) including gonorrhea and chlamydia if:  You are sexually active and are younger than 24 years.  You are older than 24 years and your health care provider tells you that you are at risk for this type of infection.  Your sexual activity has changed  since you were last screened and you are at an increased risk for chlamydia or gonorrhea. Ask your health care provider if you are at risk.  If you are at risk of being infected with HIV, it is recommended that you take a prescription medicine daily to prevent HIV infection. This is called preexposure prophylaxis (PrEP). You are considered at risk if:  You are sexually active and do not regularly use condoms or know the HIV status of your partner(s).  You take drugs by injection.  You are sexually active with a partner who has HIV.  Talk with your health care provider about whether you are at high risk of being infected with HIV. If   you choose to begin PrEP, you should first be tested for HIV. You should then be tested every 3 months for as long as you are taking PrEP.  Osteoporosis is a disease in which the bones lose minerals and strength with aging. This can result in serious bone fractures or breaks. The risk of osteoporosis can be identified using a bone density scan. Women ages 67 years and over and women at risk for fractures or osteoporosis should discuss screening with their health care providers. Ask your health care provider whether you should take a calcium supplement or vitamin D to reduce the rate of osteoporosis.  Menopause can be associated with physical symptoms and risks. Hormone replacement therapy is available to decrease symptoms and risks. You should talk to your health care provider about whether hormone replacement therapy is right for you.  Use sunscreen. Apply sunscreen liberally and repeatedly throughout the day. You should seek shade when your shadow is shorter than you. Protect yourself by wearing long sleeves, pants, a wide-brimmed hat, and sunglasses year round, whenever you are outdoors.  Once a month, do a whole body skin exam, using a mirror to look at the skin on your back. Tell your health care provider of new moles, moles that have irregular borders, moles that  are larger than a pencil eraser, or moles that have changed in shape or color.  Stay current with required vaccines (immunizations).  Influenza vaccine. All adults should be immunized every year.  Tetanus, diphtheria, and acellular pertussis (Td, Tdap) vaccine. Pregnant women should receive 1 dose of Tdap vaccine during each pregnancy. The dose should be obtained regardless of the length of time since the last dose. Immunization is preferred during the 27th-36th week of gestation. An adult who has not previously received Tdap or who does not know her vaccine status should receive 1 dose of Tdap. This initial dose should be followed by tetanus and diphtheria toxoids (Td) booster doses every 10 years. Adults with an unknown or incomplete history of completing a 3-dose immunization series with Td-containing vaccines should begin or complete a primary immunization series including a Tdap dose. Adults should receive a Td booster every 10 years.  Varicella vaccine. An adult without evidence of immunity to varicella should receive 2 doses or a second dose if she has previously received 1 dose. Pregnant females who do not have evidence of immunity should receive the first dose after pregnancy. This first dose should be obtained before leaving the health care facility. The second dose should be obtained 4-8 weeks after the first dose.  Human papillomavirus (HPV) vaccine. Females aged 13-26 years who have not received the vaccine previously should obtain the 3-dose series. The vaccine is not recommended for use in pregnant females. However, pregnancy testing is not needed before receiving a dose. If a female is found to be pregnant after receiving a dose, no treatment is needed. In that case, the remaining doses should be delayed until after the pregnancy. Immunization is recommended for any person with an immunocompromised condition through the age of 61 years if she did not get any or all doses earlier. During the  3-dose series, the second dose should be obtained 4-8 weeks after the first dose. The third dose should be obtained 24 weeks after the first dose and 16 weeks after the second dose.  Zoster vaccine. One dose is recommended for adults aged 30 years or older unless certain conditions are present.  Measles, mumps, and rubella (MMR) vaccine. Adults born  before 1957 generally are considered immune to measles and mumps. Adults born in 1957 or later should have 1 or more doses of MMR vaccine unless there is a contraindication to the vaccine or there is laboratory evidence of immunity to each of the three diseases. A routine second dose of MMR vaccine should be obtained at least 28 days after the first dose for students attending postsecondary schools, health care workers, or international travelers. People who received inactivated measles vaccine or an unknown type of measles vaccine during 1963-1967 should receive 2 doses of MMR vaccine. People who received inactivated mumps vaccine or an unknown type of mumps vaccine before 1979 and are at high risk for mumps infection should consider immunization with 2 doses of MMR vaccine. For females of childbearing age, rubella immunity should be determined. If there is no evidence of immunity, females who are not pregnant should be vaccinated. If there is no evidence of immunity, females who are pregnant should delay immunization until after pregnancy. Unvaccinated health care workers born before 1957 who lack laboratory evidence of measles, mumps, or rubella immunity or laboratory confirmation of disease should consider measles and mumps immunization with 2 doses of MMR vaccine or rubella immunization with 1 dose of MMR vaccine.  Pneumococcal 13-valent conjugate (PCV13) vaccine. When indicated, a person who is uncertain of his immunization history and has no record of immunization should receive the PCV13 vaccine. All adults 65 years of age and older should receive this  vaccine. An adult aged 19 years or older who has certain medical conditions and has not been previously immunized should receive 1 dose of PCV13 vaccine. This PCV13 should be followed with a dose of pneumococcal polysaccharide (PPSV23) vaccine. Adults who are at high risk for pneumococcal disease should obtain the PPSV23 vaccine at least 8 weeks after the dose of PCV13 vaccine. Adults older than 34 years of age who have normal immune system function should obtain the PPSV23 vaccine dose at least 1 year after the dose of PCV13 vaccine.  Pneumococcal polysaccharide (PPSV23) vaccine. When PCV13 is also indicated, PCV13 should be obtained first. All adults aged 65 years and older should be immunized. An adult younger than age 65 years who has certain medical conditions should be immunized. Any person who resides in a nursing home or long-term care facility should be immunized. An adult smoker should be immunized. People with an immunocompromised condition and certain other conditions should receive both PCV13 and PPSV23 vaccines. People with human immunodeficiency virus (HIV) infection should be immunized as soon as possible after diagnosis. Immunization during chemotherapy or radiation therapy should be avoided. Routine use of PPSV23 vaccine is not recommended for American Indians, [place] Natives, or people younger than 65 years unless there are medical conditions that require PPSV23 vaccine. When indicated, people who have unknown immunization and have no record of immunization should receive PPSV23 vaccine. One-time revaccination 5 years after the first dose of PPSV23 is recommended for people aged 19-64 years who have chronic kidney failure, nephrotic syndrome, asplenia, or immunocompromised conditions. People who received 1-2 doses of PPSV23 before age 65 years should receive another dose of PPSV23 vaccine at age 65 years or later if at least 5 years have passed since the previous dose. Doses of PPSV23 are not  needed for people immunized with PPSV23 at or after age 65 years.  Meningococcal vaccine. Adults with asplenia or persistent complement component deficiencies should receive 2 doses of quadrivalent meningococcal conjugate (MenACWY-D) vaccine. The doses should be obtained   at least 2 months apart. Microbiologists working with certain meningococcal bacteria, [place] recruits, people at risk during an outbreak, and people who travel to or live in countries with a high rate of meningitis should be immunized. A first-year college student up through age 34 years who is living in a residence hall should receive a dose if she did not receive a dose on or after her 16th birthday. Adults who have certain high-risk conditions should receive one or more doses of vaccine.  Hepatitis A vaccine. Adults who wish to be protected from this disease, have certain high-risk conditions, work with hepatitis A-infected animals, work in hepatitis A research labs, or travel to or work in countries with a high rate of hepatitis A should be immunized. Adults who were previously unvaccinated and who anticipate close contact with an international adoptee during the first 60 days after arrival in the [place] from a country with a high rate of hepatitis A should be immunized.  Hepatitis B vaccine. Adults who wish to be protected from this disease, have certain high-risk conditions, may be exposed to blood or other infectious body fluids, are household contacts or sex partners of hepatitis B positive people, are clients or workers in certain care facilities, or travel to or work in countries with a high rate of hepatitis B should be immunized.  Haemophilus influenzae type b (Hib) vaccine. A previously unvaccinated person with asplenia or sickle cell disease or having a scheduled splenectomy should receive 1 dose of Hib vaccine. Regardless of previous immunization, a recipient of a hematopoietic stem cell transplant should receive a  3-dose series 6-12 months after her successful transplant. Hib vaccine is not recommended for adults with HIV infection. Preventive Services / Frequency Ages 35 to 4 years  Blood pressure check.** / Every 3-5 years.  Lipid and cholesterol check.** / Every 5 years beginning at age 60.  Clinical breast exam.** / Every 3 years for women in their 71s and 10s.  BRCA-related cancer risk assessment.** / For women who have family members with a BRCA-related cancer (breast, ovarian, tubal, or peritoneal cancers).  Pap test.** / Every 2 years from ages 76 through 26. Every 3 years starting at age 61 through age 76 or 93 with a history of 3 consecutive normal Pap tests.  HPV screening.** / Every 3 years from ages 37 through ages 60 to 51 with a history of 3 consecutive normal Pap tests.  Hepatitis C blood test.** / For any individual with known risks for hepatitis C.  Skin self-exam. / Monthly.  Influenza vaccine. / Every year.  Tetanus, diphtheria, and acellular pertussis (Tdap, Td) vaccine.** / Consult your health care provider. Pregnant women should receive 1 dose of Tdap vaccine during each pregnancy. 1 dose of Td every 10 years.  Varicella vaccine.** / Consult your health care provider. Pregnant females who do not have evidence of immunity should receive the first dose after pregnancy.  HPV vaccine. / 3 doses over 6 months, if 93 and younger. The vaccine is not recommended for use in pregnant females. However, pregnancy testing is not needed before receiving a dose.  Measles, mumps, rubella (MMR) vaccine.** / You need at least 1 dose of MMR if you were born in 1957 or later. You may also need a 2nd dose. For females of childbearing age, rubella immunity should be determined. If there is no evidence of immunity, females who are not pregnant should be vaccinated. If there is no evidence of immunity, females who are  pregnant should delay immunization until after pregnancy.  Pneumococcal  13-valent conjugate (PCV13) vaccine.** / Consult your health care provider.  Pneumococcal polysaccharide (PPSV23) vaccine.** / 1 to 2 doses if you smoke cigarettes or if you have certain conditions.  Meningococcal vaccine.** / 1 dose if you are age 68 to 8 years and a Market researcher living in a residence hall, or have one of several medical conditions, you need to get vaccinated against meningococcal disease. You may also need additional booster doses.  Hepatitis A vaccine.** / Consult your health care provider.  Hepatitis B vaccine.** / Consult your health care provider.  Haemophilus influenzae type b (Hib) vaccine.** / Consult your health care provider. Ages 7 to 53 years  Blood pressure check.** / Every year.  Lipid and cholesterol check.** / Every 5 years beginning at age 25 years.  Lung cancer screening. / Every year if you are aged 11-80 years and have a 30-pack-year history of smoking and currently smoke or have quit within the past 15 years. Yearly screening is stopped once you have quit smoking for at least 15 years or develop a health problem that would prevent you from having lung cancer treatment.  Clinical breast exam.** / Every year after age 48 years.  BRCA-related cancer risk assessment.** / For women who have family members with a BRCA-related cancer (breast, ovarian, tubal, or peritoneal cancers).  Mammogram.** / Every year beginning at age 41 years and continuing for as long as you are in good health. Consult with your health care provider.  Pap test.** / Every 3 years starting at age 65 years through age 37 or 70 years with a history of 3 consecutive normal Pap tests.  HPV screening.** / Every 3 years from ages 72 years through ages 60 to 40 years with a history of 3 consecutive normal Pap tests.  Fecal occult blood test (FOBT) of stool. / Every year beginning at age 21 years and continuing until age 5 years. You may not need to do this test if you get  a colonoscopy every 10 years.  Flexible sigmoidoscopy or colonoscopy.** / Every 5 years for a flexible sigmoidoscopy or every 10 years for a colonoscopy beginning at age 35 years and continuing until age 48 years.  Hepatitis C blood test.** / For all people born from [birth year] through 1965 and any individual with known risks for hepatitis C.  Skin self-exam. / Monthly.  Influenza vaccine. / Every year.  Tetanus, diphtheria, and acellular pertussis (Tdap/Td) vaccine.** / Consult your health care provider. Pregnant women should receive 1 dose of Tdap vaccine during each pregnancy. 1 dose of Td every 10 years.  Varicella vaccine.** / Consult your health care provider. Pregnant females who do not have evidence of immunity should receive the first dose after pregnancy.  Zoster vaccine.** / 1 dose for adults aged 30 years or older.  Measles, mumps, rubella (MMR) vaccine.** / You need at least 1 dose of MMR if you were born in 1957 or later. You may also need a second dose. For females of childbearing age, rubella immunity should be determined. If there is no evidence of immunity, females who are not pregnant should be vaccinated. If there is no evidence of immunity, females who are pregnant should delay immunization until after pregnancy.  Pneumococcal 13-valent conjugate (PCV13) vaccine.** / Consult your health care provider.  Pneumococcal polysaccharide (PPSV23) vaccine.** / 1 to 2 doses if you smoke cigarettes or if you have certain conditions.  Meningococcal vaccine.** /  Consult your health care provider.  Hepatitis A vaccine.** / Consult your health care provider.  Hepatitis B vaccine.** / Consult your health care provider.  Haemophilus influenzae type b (Hib) vaccine.** / Consult your health care provider. Ages 64 years and over  Blood pressure check.** / Every year.  Lipid and cholesterol check.** / Every 5 years beginning at age 23 years.  Lung cancer screening. / Every year if you  are aged 16-80 years and have a 30-pack-year history of smoking and currently smoke or have quit within the past 15 years. Yearly screening is stopped once you have quit smoking for at least 15 years or develop a health problem that would prevent you from having lung cancer treatment.  Clinical breast exam.** / Every year after age 74 years.  BRCA-related cancer risk assessment.** / For women who have family members with a BRCA-related cancer (breast, ovarian, tubal, or peritoneal cancers).  Mammogram.** / Every year beginning at age 44 years and continuing for as long as you are in good health. Consult with your health care provider.  Pap test.** / Every 3 years starting at age 58 years through age 22 or 39 years with 3 consecutive normal Pap tests. Testing can be stopped between 65 and 70 years with 3 consecutive normal Pap tests and no abnormal Pap or HPV tests in the past 10 years.  HPV screening.** / Every 3 years from ages 64 years through ages 70 or 61 years with a history of 3 consecutive normal Pap tests. Testing can be stopped between 65 and 70 years with 3 consecutive normal Pap tests and no abnormal Pap or HPV tests in the past 10 years.  Fecal occult blood test (FOBT) of stool. / Every year beginning at age 40 years and continuing until age 27 years. You may not need to do this test if you get a colonoscopy every 10 years.  Flexible sigmoidoscopy or colonoscopy.** / Every 5 years for a flexible sigmoidoscopy or every 10 years for a colonoscopy beginning at age 7 years and continuing until age 32 years.  Hepatitis C blood test.** / For all people born from [birth year] through 1964 and any individual with known risks for hepatitis C.  Osteoporosis screening.** / A one-time screening for women ages 30 years and over and women at risk for fractures or osteoporosis.  Skin self-exam. / Monthly.  Influenza vaccine. / Every year.  Tetanus, diphtheria, and acellular pertussis (Tdap/Td)  vaccine.** / 1 dose of Td every 10 years.  Varicella vaccine.** / Consult your health care provider.  Zoster vaccine.** / 1 dose for adults aged 35 years or older.  Pneumococcal 13-valent conjugate (PCV13) vaccine.** / Consult your health care provider.  Pneumococcal polysaccharide (PPSV23) vaccine.** / 1 dose for all adults aged 46 years and older.  Meningococcal vaccine.** / Consult your health care provider.  Hepatitis A vaccine.** / Consult your health care provider.  Hepatitis B vaccine.** / Consult your health care provider.  Haemophilus influenzae type b (Hib) vaccine.** / Consult your health care provider. ** Family history and personal history of risk and conditions may change your health care provider's recommendations.   This information is not intended to replace advice given to you by your health care provider. Make sure you discuss any questions you have with your health care provider.   Document Released: 01/03/2002 Document Revised: 11/28/2014 Document Reviewed: 04/04/2011 Elsevier Interactive Patient Education [employer].

## 2016-07-07 LAB — CYTOLOGY - PAP

## 2016-09-23 ENCOUNTER — Encounter: Payer: Self-pay | Admitting: Gastroenterology

## 2016-09-27 ENCOUNTER — Encounter: Payer: Self-pay | Admitting: Gastroenterology

## 2016-10-31 ENCOUNTER — Ambulatory Visit (AMBULATORY_SURGERY_CENTER): Payer: Self-pay

## 2016-10-31 VITALS — Ht 67.0 in | Wt 152.6 lb

## 2016-10-31 DIAGNOSIS — Z860100 Personal history of colon polyps, unspecified: Secondary | ICD-10-CM

## 2016-10-31 MED ORDER — SUPREP BOWEL PREP KIT 17.5-3.13-1.6 GM/177ML PO SOLN: 1.0000 | Freq: Once | ORAL | 0 refills | Status: AC

## 2016-10-31 NOTE — Progress Notes (Signed)
No diet meds No home oxygen No past problems with anesthesia No allergy to eggs or soy  Declined emmi

## 2016-11-01 ENCOUNTER — Encounter: Payer: Self-pay | Admitting: Gastroenterology

## 2016-11-11 ENCOUNTER — Encounter: Payer: Self-pay | Admitting: Gastroenterology

## 2016-11-11 ENCOUNTER — Ambulatory Visit (AMBULATORY_SURGERY_CENTER): Payer: BC Managed Care – PPO | Admitting: Gastroenterology

## 2016-11-11 VITALS — BP 101/66 | HR 75 | Temp 99.1°F | Resp 14 | Ht 67.0 in | Wt 152.0 lb

## 2016-11-11 DIAGNOSIS — D123 Benign neoplasm of transverse colon: Secondary | ICD-10-CM | POA: Diagnosis not present

## 2016-11-11 DIAGNOSIS — Z86010 Personal history of colonic polyps: Secondary | ICD-10-CM

## 2016-11-11 MED ORDER — SODIUM CHLORIDE 0.9 % IV SOLN: 500.0000 mL | INTRAVENOUS | Status: DC

## 2016-11-11 NOTE — Progress Notes (Signed)
Called to room to assist during endoscopic procedure.  Patient ID and intended procedure confirmed with present staff. Received instructions for my participation in the procedure from the performing physician.  

## 2016-11-11 NOTE — Progress Notes (Signed)
A and O x3. Report to RN. Tolerated MAC anesthesia well. 

## 2016-11-11 NOTE — Patient Instructions (Signed)
Impression/Recommendations:  Polyp handout given to patient. Hemorrhoid handout given to patient.  Repeat colonoscopy in 5 years for surveillance based on pathology results.  YOU HAD AN ENDOSCOPIC PROCEDURE TODAY AT [facility]:   Refer to the procedure report that was given to you for any specific questions about what was found during the examination.  If the procedure report does not answer your questions, please call your gastroenterologist to clarify.  If you requested that your care partner not be given the details of your procedure findings, then the procedure report has been included in a sealed envelope for you to review at your convenience later.  YOU SHOULD EXPECT: Some feelings of bloating in the abdomen. Passage of more gas than usual.  Walking can help get rid of the air that was put into your GI tract during the procedure and reduce the bloating. If you had a lower endoscopy (such as a colonoscopy or flexible sigmoidoscopy) you may notice spotting of blood in your stool or on the toilet paper. If you underwent a bowel prep for your procedure, you may not have a normal bowel movement for a few days.  Please Note:  You might notice some irritation and congestion in your nose or some drainage.  This is from the oxygen used during your procedure.  There is no need for concern and it should clear up in a day or so.  SYMPTOMS TO REPORT IMMEDIATELY:   Following lower endoscopy (colonoscopy or flexible sigmoidoscopy):  Excessive amounts of blood in the stool  Significant tenderness or worsening of abdominal pains  Swelling of the abdomen that is new, acute  Fever of 100F or higher For urgent or emergent issues, a gastroenterologist can be reached at any hour by calling [phone].   DIET:  We do recommend a small meal at first, but then you may proceed to your regular diet.  Drink plenty of fluids but you should avoid alcoholic beverages for 24 hours.  ACTIVITY:   You should plan to take it easy for the rest of today and you should NOT DRIVE or use heavy machinery until tomorrow (because of the sedation medicines used during the test).    FOLLOW UP: Our staff will call the number listed on your records the next business day following your procedure to check on you and address any questions or concerns that you may have regarding the information given to you following your procedure. If we do not reach you, we will leave a message.  However, if you are feeling well and you are not experiencing any problems, there is no need to return our call.  We will assume that you have returned to your regular daily activities without incident.  If any biopsies were taken you will be contacted by phone or by letter within the next 1-3 weeks.  Please call us at [phone] if you have not heard about the biopsies in 3 weeks.    SIGNATURES/CONFIDENTIALITY: You and/or your care partner have signed paperwork which will be entered into your electronic medical record.  These signatures attest to the fact that that the information above on your After Visit Summary has been reviewed and is understood.  Full responsibility of the confidentiality of this discharge information lies with you and/or your care-partner.

## 2016-11-11 NOTE — Op Note (Signed)
[facility] Patient Name: [name] Procedure Date: 11/11/2016 2:32 PM MRN: [id number] Endoscopist: Mauri Pole , MD Age: 35 Referring MD:  Date of Birth: September 03, 1981 Gender: Female Account #: [id number] Procedure:                Colonoscopy Indications:              Surveillance: Personal history of adenomatous                            polyps on last colonoscopy 3 years ago, High risk                            colon cancer surveillance: Personal history of                            sessile serrated colon polyp (10 mm or greater in                            size) Medicines:                Monitored Anesthesia Care Procedure:                Pre-Anesthesia Assessment:                           - Prior to the procedure, a History and Physical                            was performed, and patient medications and                            allergies were reviewed. The patient's tolerance of                            previous anesthesia was also reviewed. The risks                            and benefits of the procedure and the sedation                            options and risks were discussed with the patient.                            All questions were answered, and informed consent                            was obtained. Prior Anticoagulants: The patient has                            taken no previous anticoagulant or antiplatelet                            agents. ASA Grade Assessment: II - A patient with  mild systemic disease. After reviewing the risks                            and benefits, the patient was deemed in                            satisfactory condition to undergo the procedure.                           After obtaining informed consent, the colonoscope                            was passed under direct vision. Throughout the                            procedure, the patient's blood pressure, pulse, and                    oxygen saturations were monitored continuously. The                            Model PCF-H190L [id number]) scope was introduced                            through the anus and advanced to the the terminal                            ileum, with identification of the appendiceal                            orifice and IC valve. The colonoscopy was performed                            without difficulty. The patient tolerated the                            procedure well. The quality of the bowel                            preparation was excellent. The terminal ileum,                            ileocecal valve, appendiceal orifice, and rectum                            were photographed. Scope In: 3:11:01 PM Scope Out: 3:31:02 PM Scope Withdrawal Time: 0 hours 10 minutes 37 seconds  Total Procedure Duration: 0 hours 20 minutes 1 second  Findings:                 The perianal and digital rectal examinations were                            normal.  A 3 mm polyp was found in the transverse colon. The                            polyp was sessile. The polyp was removed with a                            cold biopsy forceps. Resection and retrieval were                            complete.                           Non-bleeding internal hemorrhoids were found during                            retroflexion. The hemorrhoids were small.                           The exam was otherwise without abnormality. Complications:            No immediate complications. Estimated Blood Loss:     Estimated blood loss was minimal. Impression:               - One 3 mm polyp in the transverse colon, removed                            with a cold biopsy forceps. Resected and retrieved.                           - Non-bleeding internal hemorrhoids.                           - The examination was otherwise normal. Recommendation:           - Patient has a contact number  available for                            emergencies. The signs and symptoms of potential                            delayed complications were discussed with the                            patient. Return to normal activities tomorrow.                            Written discharge instructions were provided to the                            patient.                           - Resume previous diet.                           - Continue present medications.                           -  Await pathology results.                           - Repeat colonoscopy in 5 years for surveillance                            based on pathology results.                           - Return to GI clinic PRN. Mauri Pole, MD 11/11/2016 3:36:53 PM This report has been signed electronically.

## 2016-11-15 ENCOUNTER — Telehealth: Payer: Self-pay | Admitting: *Deleted

## 2016-11-15 NOTE — Telephone Encounter (Signed)
  Follow up Call-  Call back number 11/11/2016  Post procedure Call Back phone  # [phone]  Permission to leave phone message Yes  Some recent data might be hidden    [facility]

## 2016-11-17 ENCOUNTER — Encounter: Payer: Self-pay | Admitting: Gastroenterology

## 2016-11-18 ENCOUNTER — Encounter: Payer: Self-pay | Admitting: Family Medicine

## 2016-11-18 DIAGNOSIS — D126 Benign neoplasm of colon, unspecified: Secondary | ICD-10-CM | POA: Insufficient documentation

## 2017-08-22 ENCOUNTER — Ambulatory Visit (INDEPENDENT_AMBULATORY_CARE_PROVIDER_SITE_OTHER): Payer: BC Managed Care – PPO | Admitting: Family Medicine

## 2017-08-22 ENCOUNTER — Encounter: Payer: Self-pay | Admitting: Family Medicine

## 2017-08-22 VITALS — BP 113/75 | HR 69 | Wt 162.6 lb

## 2017-08-22 DIAGNOSIS — Z01419 Encounter for gynecological examination (general) (routine) without abnormal findings: Secondary | ICD-10-CM | POA: Diagnosis not present

## 2017-08-22 DIAGNOSIS — J3089 Other allergic rhinitis: Secondary | ICD-10-CM

## 2017-08-22 MED ORDER — FEXOFENADINE HCL 180 MG PO TABS: 180.0000 mg | ORAL_TABLET | Freq: Every day | ORAL | 5 refills | Status: AC

## 2017-08-22 NOTE — Progress Notes (Signed)
Last pap 07/06/2016

## 2017-08-22 NOTE — Progress Notes (Signed)
  Subjective:     [name] is a 36 y.o. female and is here for a comprehensive physical exam. The patient reports no problems. Works as a Educational psychologist at [employer]. Has IUD since 2015. No cycles.  Social History   Social History  . Marital status: Married    Spouse name: N/A  . Number of children: 2  . Years of education: N/A   Occupational History  . [employer]   Social History Main Topics  . Smoking status: Never Smoker  . Smokeless tobacco: Never Used  . Alcohol use Yes     Comment: occasional;once monthly  . Drug use: No  . Sexual activity: Yes    Partners: Male    Birth control/ protection: IUD   Other Topics Concern  . Not on file   Social History Narrative  . No narrative on file   Health Maintenance  Topic Date Due  . TETANUS/TDAP  11/29/2000  . INFLUENZA VACCINE  08/28/2017 (Originally 06/21/2017)  . PAP SMEAR  07/07/2019  . COLONOSCOPY  11/12/2019  . HIV Screening  Completed    The following portions of the patient's history were reviewed and updated as appropriate: allergies, current medications, past family history, past medical history, past social history, past surgical history and problem list.  Review of Systems Pertinent items noted in HPI and remainder of comprehensive ROS otherwise negative.   Objective:    BP 113/75   Pulse 69   Wt 162 lb 9.6 oz (73.8 kg)   BMI 25.47 kg/m  General appearance: alert, cooperative and appears stated age Head: Normocephalic, without obvious abnormality, atraumatic Neck: no adenopathy, supple, symmetrical, trachea midline and thyroid not enlarged, symmetric, no tenderness/mass/nodules Lungs: clear to auscultation bilaterally Breasts: normal appearance, no masses or tenderness Heart: regular rate and rhythm, S1, S2 normal, no murmur, click, rub or gallop Abdomen: soft, non-tender; bowel sounds normal; no masses,  no organomegaly Pelvic: cervix normal in appearance, external genitalia normal, no adnexal  masses or tenderness, no cervical motion tenderness, uterus normal size, shape, and consistency, vagina normal without discharge and IUD strings seen Extremities: extremities normal, atraumatic, no cyanosis or edema Pulses: 2+ and symmetric Skin: Skin color, texture, turgor normal. No rashes or lesions Lymph nodes: Cervical, supraclavicular, and axillary nodes normal. Neurologic: Grossly normal    Assessment:    Healthy female exam.      Plan:      Problem List Items Addressed This Visit    None    Visit Diagnoses    Encounter for gynecological examination without abnormal finding    -  Primary   Relevant Orders   CBC (Completed)   Comprehensive metabolic panel (Completed)   TSH (Completed)   Lipid panel (Completed)   Non-seasonal allergic rhinitis, unspecified trigger       Relevant Medications   fexofenadine (ALLEGRA ALLERGY) 180 MG tablet      See After Visit Summary for Counseling Recommendations

## 2017-08-22 NOTE — Patient Instructions (Signed)
Preventive Care 18-39 Years, Female Preventive care refers to lifestyle choices and visits with your health care provider that can promote health and wellness. What does preventive care include?  A yearly physical exam. This is also called an annual well check.  Dental exams once or twice a year.  Routine eye exams. Ask your health care provider how often you should have your eyes checked.  Personal lifestyle choices, including: ? Daily care of your teeth and gums. ? Regular physical activity. ? Eating a healthy diet. ? Avoiding tobacco and drug use. ? Limiting alcohol use. ? Practicing safe sex. ? Taking vitamin and mineral supplements as recommended by your health care provider. What happens during an annual well check? The services and screenings done by your health care provider during your annual well check will depend on your age, overall health, lifestyle risk factors, and family history of disease. Counseling Your health care provider may ask you questions about your:  Alcohol use.  Tobacco use.  Drug use.  Emotional well-being.  Home and relationship well-being.  Sexual activity.  Eating habits.  Work and work Statistician.  Method of birth control.  Menstrual cycle.  Pregnancy history.  Screening You may have the following tests or measurements:  Height, weight, and BMI.  Diabetes screening. This is done by checking your blood sugar (glucose) after you have not eaten for a while (fasting).  Blood pressure.  Lipid and cholesterol levels. These may be checked every 5 years starting at age 66.  Skin check.  Hepatitis C blood test.  Hepatitis B blood test.  Sexually transmitted disease (STD) testing.  BRCA-related cancer screening. This may be done if you have a family history of breast, ovarian, tubal, or peritoneal cancers.  Pelvic exam and Pap test. This may be done every 3 years starting at age 40. Starting at age 59, this may be done every 5  years if you have a Pap test in combination with an HPV test.  Discuss your test results, treatment options, and if necessary, the need for more tests with your health care provider. Vaccines Your health care provider may recommend certain vaccines, such as:  Influenza vaccine. This is recommended every year.  Tetanus, diphtheria, and acellular pertussis (Tdap, Td) vaccine. You may need a Td booster every 10 years.  Varicella vaccine. You may need this if you have not been vaccinated.  HPV vaccine. If you are 69 or younger, you may need three doses over 6 months.  Measles, mumps, and rubella (MMR) vaccine. You may need at least one dose of MMR. You may also need a second dose.  Pneumococcal 13-valent conjugate (PCV13) vaccine. You may need this if you have certain conditions and were not previously vaccinated.  Pneumococcal polysaccharide (PPSV23) vaccine. You may need one or two doses if you smoke cigarettes or if you have certain conditions.  Meningococcal vaccine. One dose is recommended if you are age 27-21 years and a first-year college student living in a residence hall, or if you have one of several medical conditions. You may also need additional booster doses.  Hepatitis A vaccine. You may need this if you have certain conditions or if you travel or work in places where you may be exposed to hepatitis A.  Hepatitis B vaccine. You may need this if you have certain conditions or if you travel or work in places where you may be exposed to hepatitis B.  Haemophilus influenzae type b (Hib) vaccine. You may need this if  you have certain risk factors.  Talk to your health care provider about which screenings and vaccines you need and how often you need them. This information is not intended to replace advice given to you by your health care provider. Make sure you discuss any questions you have with your health care provider. Document Released: 01/03/2002 Document Revised: 07/27/2016  Document Reviewed: 09/08/2015 Elsevier Interactive Patient Education  2017 [employer].

## 2017-08-23 LAB — COMPREHENSIVE METABOLIC PANEL
A/G RATIO: 2.2 (ref 1.2–2.2)
ALBUMIN: 5 g/dL (ref 3.5–5.5)
ALT: 10 IU/L (ref 0–32)
AST: 19 IU/L (ref 0–40)
Alkaline Phosphatase: 46 IU/L (ref 39–117)
BUN / CREAT RATIO: 11 (ref 9–23)
BUN: 8 mg/dL (ref 6–20)
Bilirubin Total: 0.5 mg/dL (ref 0.0–1.2)
CALCIUM: 9.3 mg/dL (ref 8.7–10.2)
CO2: 23 mmol/L (ref 20–29)
Chloride: 100 mmol/L (ref 96–106)
Creatinine, Ser: 0.76 mg/dL (ref 0.57–1.00)
GFR, EST AFRICAN AMERICAN: 118 mL/min/{1.73_m2} (ref >59)
GFR, EST NON AFRICAN AMERICAN: 102 mL/min/{1.73_m2} (ref >59)
GLOBULIN, TOTAL: 2.3 g/dL (ref 1.5–4.5)
Glucose: 85 mg/dL (ref 65–99)
POTASSIUM: 3.6 mmol/L (ref 3.5–5.2)
Sodium: 139 mmol/L (ref 134–144)
TOTAL PROTEIN: 7.3 g/dL (ref 6.0–8.5)

## 2017-08-23 LAB — CBC
HEMATOCRIT: 38 % (ref 34.0–46.6)
HEMOGLOBIN: 12.7 g/dL (ref 11.1–15.9)
MCH: 31.7 pg (ref 26.6–33.0)
MCHC: 33.4 g/dL (ref 31.5–35.7)
MCV: 95 fL (ref 79–97)
Platelets: 251 10*3/uL (ref 150–379)
RBC: 4.01 x10E6/uL (ref 3.77–5.28)
RDW: 13.1 % (ref 12.3–15.4)
WBC: 7 10*3/uL (ref 3.4–10.8)

## 2017-08-23 LAB — TSH: TSH: 2.68 u[IU]/mL (ref 0.450–4.500)

## 2017-08-23 LAB — LIPID PANEL
CHOL/HDL RATIO: 2.6 ratio (ref 0.0–4.4)
Cholesterol, Total: 161 mg/dL (ref 100–199)
HDL: 63 mg/dL (ref >39)
LDL CALC: 85 mg/dL (ref 0–99)
TRIGLYCERIDES: 67 mg/dL (ref 0–149)
VLDL Cholesterol Cal: 13 mg/dL (ref 5–40)

## 2018-07-19 ENCOUNTER — Encounter: Payer: Self-pay | Admitting: Radiology

## 2018-09-04 ENCOUNTER — Encounter: Payer: Self-pay | Admitting: Family Medicine

## 2018-09-04 ENCOUNTER — Ambulatory Visit (INDEPENDENT_AMBULATORY_CARE_PROVIDER_SITE_OTHER): Payer: BC Managed Care – PPO | Admitting: Family Medicine

## 2018-09-04 DIAGNOSIS — Z1151 Encounter for screening for human papillomavirus (HPV): Secondary | ICD-10-CM

## 2018-09-04 DIAGNOSIS — Z124 Encounter for screening for malignant neoplasm of cervix: Secondary | ICD-10-CM

## 2018-09-04 DIAGNOSIS — Z01419 Encounter for gynecological examination (general) (routine) without abnormal findings: Secondary | ICD-10-CM | POA: Diagnosis not present

## 2018-09-04 NOTE — Progress Notes (Signed)
  Subjective:     [name] is a 37 y.o. female and is here for a comprehensive physical exam. The patient reports no problems.  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  The following portions of the patient's history were reviewed and updated as appropriate: allergies, current medications, past family history, past medical history, past social history, past surgical history and problem list.  Review of Systems Pertinent items noted in HPI and remainder of comprehensive ROS otherwise negative.   Objective:    BP 113/73   Pulse 73   Wt 153 lb (69.4 kg)   BMI 23.96 kg/m  General appearance: alert, cooperative and appears stated age Head: Normocephalic, without obvious abnormality, atraumatic Neck: no adenopathy, supple, symmetrical, trachea midline and thyroid not enlarged, symmetric, no tenderness/mass/nodules Lungs: clear to auscultation bilaterally Breasts: normal appearance, no masses or tenderness Heart: regular rate and rhythm, S1, S2 normal, no murmur, click, rub or gallop Abdomen: soft, non-tender; bowel sounds normal; no masses,  no organomegaly Pelvic: cervix normal in appearance, external genitalia normal, no adnexal masses or tenderness, no cervical motion tenderness, uterus normal size, shape, and consistency and vagina normal without discharge Extremities: extremities normal, atraumatic, no cyanosis or edema Pulses: 2+ and symmetric Skin: Skin color, texture, turgor normal. No rashes or lesions Lymph nodes: Cervical, supraclavicular, and axillary nodes normal. Neurologic: Grossly normal    Assessment:    Healthy female exam.      Plan:      Problem List Items Addressed This Visit    None    Visit Diagnoses    Screening for malignant neoplasm of cervix       Relevant Orders   Cytology - PAP   Encounter for  gynecological examination without abnormal finding         Return in 1 year (on 09/05/2019).  See After Visit Summary for Counseling Recommendations

## 2018-09-04 NOTE — Progress Notes (Signed)
LAST PAP 07/06/2016 NORMAL

## 2018-09-04 NOTE — Patient Instructions (Addendum)
Preventive Care 18-39 Years, Female Preventive care refers to lifestyle choices and visits with your health care provider that can promote health and wellness. What does preventive care include?  A yearly physical exam. This is also called an annual well check.  Dental exams once or twice a year.  Routine eye exams. Ask your health care provider how often you should have your eyes checked.  Personal lifestyle choices, including: ? Daily care of your teeth and gums. ? Regular physical activity. ? Eating a healthy diet. ? Avoiding tobacco and drug use. ? Limiting alcohol use. ? Practicing safe sex. ? Taking vitamin and mineral supplements as recommended by your health care provider. What happens during an annual well check? The services and screenings done by your health care provider during your annual well check will depend on your age, overall health, lifestyle risk factors, and family history of disease. Counseling Your health care provider may ask you questions about your:  Alcohol use.  Tobacco use.  Drug use.  Emotional well-being.  Home and relationship well-being.  Sexual activity.  Eating habits.  Work and work Statistician.  Method of birth control.  Menstrual cycle.  Pregnancy history.  Screening You may have the following tests or measurements:  Height, weight, and BMI.  Diabetes screening. This is done by checking your blood sugar (glucose) after you have not eaten for a while (fasting).  Blood pressure.  Lipid and cholesterol levels. These may be checked every 5 years starting at age 38.  Skin check.  Hepatitis C blood test.  Hepatitis B blood test.  Sexually transmitted disease (STD) testing.  BRCA-related cancer screening. This may be done if you have a family history of breast, ovarian, tubal, or peritoneal cancers.  Pelvic exam and Pap test. This may be done every 3 years starting at age 38. Starting at age 30, this may be done  every 5 years if you have a Pap test in combination with an HPV test.  Discuss your test results, treatment options, and if necessary, the need for more tests with your health care provider. Vaccines Your health care provider may recommend certain vaccines, such as:  Influenza vaccine. This is recommended every year.  Tetanus, diphtheria, and acellular pertussis (Tdap, Td) vaccine. You may need a Td booster every 10 years.  Varicella vaccine. You may need this if you have not been vaccinated.  HPV vaccine. If you are 39 or younger, you may need three doses over 6 months.  Measles, mumps, and rubella (MMR) vaccine. You may need at least one dose of MMR. You may also need a second dose.  Pneumococcal 13-valent conjugate (PCV13) vaccine. You may need this if you have certain conditions and were not previously vaccinated.  Pneumococcal polysaccharide (PPSV23) vaccine. You may need one or two doses if you smoke cigarettes or if you have certain conditions.  Meningococcal vaccine. One dose is recommended if you are age 68-21 years and a first-year college student living in a residence hall, or if you have one of several medical conditions. You may also need additional booster doses.  Hepatitis A vaccine. You may need this if you have certain conditions or if you travel or work in places where you may be exposed to hepatitis A.  Hepatitis B vaccine. You may need this if you have certain conditions or if you travel or work in places where you may be exposed to hepatitis B.  Haemophilus influenzae type b (Hib) vaccine. You may need this  if you have certain risk factors.  Talk to your health care provider about which screenings and vaccines you need and how often you need them. This information is not intended to replace advice given to you by your health care provider. Make sure you discuss any questions you have with your health care provider. Document Released: 01/03/2002 Document Revised:  07/27/2016 Document Reviewed: 09/08/2015 Elsevier Interactive Patient Education  2018 Elsevier Inc.  

## 2018-09-10 LAB — CYTOLOGY - PAP
DIAGNOSIS: NEGATIVE
HPV (WINDOPATH): NOT DETECTED

## 2019-07-02 ENCOUNTER — Encounter: Payer: Self-pay | Admitting: Radiology

## 2019-07-08 ENCOUNTER — Ambulatory Visit (INDEPENDENT_AMBULATORY_CARE_PROVIDER_SITE_OTHER): Payer: BC Managed Care – PPO | Admitting: Family Medicine

## 2019-07-08 ENCOUNTER — Encounter: Payer: Self-pay | Admitting: *Deleted

## 2019-07-08 ENCOUNTER — Other Ambulatory Visit: Payer: Self-pay

## 2019-07-08 ENCOUNTER — Encounter: Payer: Self-pay | Admitting: Family Medicine

## 2019-07-08 VITALS — BP 117/73 | HR 75 | Wt 164.0 lb

## 2019-07-08 DIAGNOSIS — Z30430 Encounter for insertion of intrauterine contraceptive device: Secondary | ICD-10-CM

## 2019-07-08 MED ORDER — LEVONORGESTREL 19.5 MCG/DAY IU IUD
INTRAUTERINE_SYSTEM | Freq: Once | INTRAUTERINE | Status: AC
  Administered 2019-07-08: 1 via INTRAUTERINE

## 2019-07-08 NOTE — Patient Instructions (Signed)
Intrauterine Device Insertion, Care After  This sheet gives you information about how to care for yourself after your procedure. Your health care provider may also give you more specific instructions. If you have problems or questions, contact your health care provider. What can I expect after the procedure? After the procedure, it is common to have:  Cramps and pain in the abdomen.  Light bleeding (spotting) or heavier bleeding that is like your menstrual period. This may last for up to a few days.  Lower back pain.  Dizziness.  Headaches.  Nausea. Follow these instructions at home:  Before resuming sexual activity, check to make sure that you can feel the IUD string(s). You should be able to feel the end of the string(s) below the opening of your cervix. If your IUD string is in place, you may resume sexual activity. ? If you had a hormonal IUD inserted more than 7 days after your most recent period started, you will need to use a backup method of birth control for 7 days after IUD insertion. Ask your health care provider whether this applies to you.  Continue to check that the IUD is still in place by feeling for the string(s) after every menstrual period, or once a month.  Take over-the-counter and prescription medicines only as told by your health care provider.  Do not drive or use heavy machinery while taking prescription pain medicine.  Keep all follow-up visits as told by your health care provider. This is important. Contact a health care provider if:  You have bleeding that is heavier or lasts longer than a normal menstrual cycle.  You have a fever.  You have cramps or abdominal pain that get worse or do not get better with medicine.  You develop abdominal pain that is new or is not in the same area of earlier cramping and pain.  You feel lightheaded or weak.  You have abnormal or bad-smelling discharge from your vagina.  You have pain during sexual activity.   You have any of the following problems with your IUD string(s): ? The string bothers or hurts you or your sexual partner. ? You cannot feel the string. ? The string has gotten longer.  You can feel the IUD in your vagina.  You think you may be pregnant, or you miss your menstrual period.  You think you may have an STI (sexually transmitted infection). Get help right away if:  You have flu-like symptoms.  You have a fever and chills.  You can feel that your IUD has slipped out of place. Summary  After the procedure, it is common to have cramps and pain in the abdomen. It is also common to have light bleeding (spotting) or heavier bleeding that is like your menstrual period.  Continue to check that the IUD is still in place by feeling for the string(s) after every menstrual period, or once a month.  Keep all follow-up visits as told by your health care provider. This is important.  Contact your health care provider if you have problems with your IUD string(s), such as the string getting longer or bothering you or your sexual partner. This information is not intended to replace advice given to you by your health care provider. Make sure you discuss any questions you have with your health care provider. Document Released: 07/06/2011 Document Revised: 10/20/2017 Document Reviewed: 09/28/2016 Elsevier Patient Education  2020 [employer].

## 2019-07-09 NOTE — Progress Notes (Signed)
   Subjective: Patient here for IUD change.  Has been in for 5 years and due to be changed. She has no cycles and desires to continue Progesterone containing IUD for contraception.  Objective: Vitals:   07/08/19 1559  BP: 117/73  Pulse: 75    Patient identified, informed consent performed, signed copy in chart, time out was performed Procedure: Speculum placed inside vagina.  Cervix visualized.  Strings grasped with ring forceps.  IUD removed intact.  Procedure: Cervix visualized.  Cleaned with Betadine x 2.  Grasped anteriourly with a single tooth tenaculum. Os finder used to open cervix.  Uterus sounded to 6.5 cm.  Liletta IUD placed per manufacturer's recommendations.  Strings trimmed to 3 cm.   Patient given post procedure instructions and Liletta care card with expiration date.  Patient is asked to check IUD strings periodically.  Impression: Encounter for insertion of intrauterine contraceptive device (IUD) - Plan: levonorgestrel (LILETTA) 19.5 MCG/DAY IUD   Plan: Follow up in 4-6 weeks for IUD check. Continue IUD for 5 years.

## 2019-08-01 ENCOUNTER — Encounter: Payer: Self-pay | Admitting: Family Medicine

## 2019-08-01 ENCOUNTER — Other Ambulatory Visit: Payer: Self-pay

## 2019-08-01 ENCOUNTER — Ambulatory Visit (INDEPENDENT_AMBULATORY_CARE_PROVIDER_SITE_OTHER): Payer: BC Managed Care – PPO | Admitting: Family Medicine

## 2019-08-01 VITALS — BP 110/78 | HR 75 | Ht 67.0 in | Wt 166.4 lb

## 2019-08-01 DIAGNOSIS — Z30431 Encounter for routine checking of intrauterine contraceptive device: Secondary | ICD-10-CM

## 2019-08-01 DIAGNOSIS — Z01419 Encounter for gynecological examination (general) (routine) without abnormal findings: Secondary | ICD-10-CM

## 2019-08-01 NOTE — Progress Notes (Signed)
Subjective:     [name] is a 38 y.o. female and is here for a comprehensive physical exam. The patient reports no problems.  Social History   Socioeconomic History  . Marital status: Married    Spouse name: Not on file  . Number of children: 2  . Years of education: Not on file  . Highest education level: Not on file  Occupational History  . Occupation: Real Research scientist (physical sciences): [employer]  Social Needs  . Financial resource strain: Not on file  . Food insecurity    Worry: Not on file    Inability: Not on file  . Transportation needs    Medical: Not on file    Non-medical: Not on file  Tobacco Use  . Smoking status: Never Smoker  . Smokeless tobacco: Never Used  Substance and Sexual Activity  . Alcohol use: Yes    Comment: occasional;once monthly  . Drug use: No  . Sexual activity: Yes    Partners: Male    Birth control/protection: I.U.D.  Lifestyle  . Physical activity    Days per week: Not on file    Minutes per session: Not on file  . Stress: Not on file  Relationships  . Social Herbalist on phone: Not on file    Gets together: Not on file    Attends religious service: Not on file    Active member of club or organization: Not on file    Attends meetings of clubs or organizations: Not on file    Relationship status: Not on file  . Intimate partner violence    Fear of current or ex partner: Not on file    Emotionally abused: Not on file    Physically abused: Not on file    Forced sexual activity: Not on file  Other Topics Concern  . Not on file  Social History Narrative  . Not on file   Health Maintenance  Topic Date Due  . INFLUENZA VACCINE  06/22/2019  . COLONOSCOPY  11/12/2019  . PAP SMEAR-Modifier  09/04/2021  . TETANUS/TDAP  08/09/2023  . HIV Screening  Completed    The following portions of the patient's history were reviewed and updated as appropriate: allergies, current medications, past family history, past medical history,  past social history, past surgical history and problem list.  Review of Systems Pertinent items noted in HPI and remainder of comprehensive ROS otherwise negative.   Objective:    BP 110/78   Pulse 75   Ht 5\' 7"  (1.702 m)   Wt 166 lb 6.4 oz (75.5 kg)   BMI 26.06 kg/m  General appearance: alert, cooperative and appears stated age Head: Normocephalic, without obvious abnormality, atraumatic Neck: no adenopathy, supple, symmetrical, trachea midline and thyroid not enlarged, symmetric, no tenderness/mass/nodules Lungs: clear to auscultation bilaterally Breasts: normal appearance, no masses or tenderness Heart: regular rate and rhythm, S1, S2 normal, no murmur, click, rub or gallop Abdomen: soft, non-tender; bowel sounds normal; no masses,  no organomegaly Pelvic: cervix normal in appearance, external genitalia normal, no adnexal masses or tenderness, no cervical motion tenderness, uterus normal size, shape, and consistency, vagina normal without discharge and IUD strings noted Extremities: extremities normal, atraumatic, no cyanosis or edema Pulses: 2+ and symmetric Skin: Skin color, texture, turgor normal. No rashes or lesions Lymph nodes: Cervical, supraclavicular, and axillary nodes normal. Neurologic: Grossly normal    Assessment:    Healthy female exam.  Plan:   Problem List Items Addressed This Visit    None    Visit Diagnoses    Gynecologic exam normal    -  Primary   Relevant Orders   CBC (Completed)   TSH (Completed)   Comprehensive metabolic panel (Completed)   VITAMIN D 25 Hydroxy (Vit-D Deficiency, Fractures) (Completed)   Lipid panel (Completed)   IUD check up       in the correct place     Return in 1 year (on 07/31/2020).    See After Visit Summary for Counseling Recommendations

## 2019-08-01 NOTE — Patient Instructions (Signed)
 Preventive Care 21-37 Years Old, Female Preventive care refers to visits with your health care provider and lifestyle choices that can promote health and wellness. This includes:  A yearly physical exam. This may also be called an annual well check.  Regular dental visits and eye exams.  Immunizations.  Screening for certain conditions.  Healthy lifestyle choices, such as eating a healthy diet, getting regular exercise, not using drugs or products that contain nicotine and tobacco, and limiting alcohol use. What can I expect for my preventive care visit? Physical exam Your health care provider will check your:  Height and weight. This may be used to calculate body mass index (BMI), which tells if you are at a healthy weight.  Heart rate and blood pressure.  Skin for abnormal spots. Counseling Your health care provider may ask you questions about your:  Alcohol, tobacco, and drug use.  Emotional well-being.  Home and relationship well-being.  Sexual activity.  Eating habits.  Work and work environment.  Method of birth control.  Menstrual cycle.  Pregnancy history. What immunizations do I need?  Influenza (flu) vaccine  This is recommended every year. Tetanus, diphtheria, and pertussis (Tdap) vaccine  You may need a Td booster every 10 years. Varicella (chickenpox) vaccine  You may need this if you have not been vaccinated. Human papillomavirus (HPV) vaccine  If recommended by your health care provider, you may need three doses over 6 months. Measles, mumps, and rubella (MMR) vaccine  You may need at least one dose of MMR. You may also need a second dose. Meningococcal conjugate (MenACWY) vaccine  One dose is recommended if you are age 19-21 years and a first-year college student living in a residence hall, or if you have one of several medical conditions. You may also need additional booster doses. Pneumococcal conjugate (PCV13) vaccine  You may need  this if you have certain conditions and were not previously vaccinated. Pneumococcal polysaccharide (PPSV23) vaccine  You may need one or two doses if you smoke cigarettes or if you have certain conditions. Hepatitis A vaccine  You may need this if you have certain conditions or if you travel or work in places where you may be exposed to hepatitis A. Hepatitis B vaccine  You may need this if you have certain conditions or if you travel or work in places where you may be exposed to hepatitis B. Haemophilus influenzae type b (Hib) vaccine  You may need this if you have certain conditions. You may receive vaccines as individual doses or as more than one vaccine together in one shot (combination vaccines). Talk with your health care provider about the risks and benefits of combination vaccines. What tests do I need?  Blood tests  Lipid and cholesterol levels. These may be checked every 5 years starting at age 20.  Hepatitis C test.  Hepatitis B test. Screening  Diabetes screening. This is done by checking your blood sugar (glucose) after you have not eaten for a while (fasting).  Sexually transmitted disease (STD) testing.  BRCA-related cancer screening. This may be done if you have a family history of breast, ovarian, tubal, or peritoneal cancers.  Pelvic exam and Pap test. This may be done every 3 years starting at age 21. Starting at age 30, this may be done every 5 years if you have a Pap test in combination with an HPV test. Talk with your health care provider about your test results, treatment options, and if necessary, the need for more   tests. Follow these instructions at home: Eating and drinking   Eat a diet that includes fresh fruits and vegetables, whole grains, lean protein, and low-fat dairy.  Take vitamin and mineral supplements as recommended by your health care provider.  Do not drink alcohol if: ? Your health care provider tells you not to drink. ? You are  pregnant, may be pregnant, or are planning to become pregnant.  If you drink alcohol: ? Limit how much you have to 0-1 drink a day. ? Be aware of how much alcohol is in your drink. In the [place] one drink equals one 12 oz bottle of beer (355 mL), one 5 oz glass of wine (148 mL), or one 1 oz glass of hard liquor (44 mL). Lifestyle  Take daily care of your teeth and gums.  Stay active. Exercise for at least 30 minutes on 5 or more days each week.  Do not use any products that contain nicotine or tobacco, such as cigarettes, e-cigarettes, and chewing tobacco. If you need help quitting, ask your health care provider.  If you are sexually active, practice safe sex. Use a condom or other form of birth control (contraception) in order to prevent pregnancy and STIs (sexually transmitted infections). If you plan to become pregnant, see your health care provider for a preconception visit. What's next?  Visit your health care provider once a year for a well check visit.  Ask your health care provider how often you should have your eyes and teeth checked.  Stay up to date on all vaccines. This information is not intended to replace advice given to you by your health care provider. Make sure you discuss any questions you have with your health care provider. Document Released: 01/03/2002 Document Revised: 07/19/2018 Document Reviewed: 07/19/2018 Elsevier Patient Education  2020 [employer].

## 2019-08-01 NOTE — Progress Notes (Signed)
Last pap- normal 09/04/2018

## 2019-08-01 NOTE — Progress Notes (Signed)
Flu declined - 08/01/2019

## 2019-08-02 ENCOUNTER — Encounter: Payer: Self-pay | Admitting: Family Medicine

## 2019-08-02 ENCOUNTER — Telehealth: Payer: Self-pay | Admitting: *Deleted

## 2019-08-02 LAB — LIPID PANEL
Chol/HDL Ratio: 2.5 ratio (ref 0.0–4.4)
Cholesterol, Total: 162 mg/dL (ref 100–199)
HDL: 65 mg/dL (ref >39)
LDL Chol Calc (NIH): 85 mg/dL (ref 0–99)
Triglycerides: 59 mg/dL (ref 0–149)
VLDL Cholesterol Cal: 12 mg/dL (ref 5–40)

## 2019-08-02 LAB — CBC
Hematocrit: 40.1 % (ref 34.0–46.6)
Hemoglobin: 13.1 g/dL (ref 11.1–15.9)
MCH: 31.5 pg (ref 26.6–33.0)
MCHC: 32.7 g/dL (ref 31.5–35.7)
MCV: 96 fL (ref 79–97)
Platelets: 271 10*3/uL (ref 150–450)
RBC: 4.16 x10E6/uL (ref 3.77–5.28)
RDW: 12.2 % (ref 11.7–15.4)
WBC: 6.6 10*3/uL (ref 3.4–10.8)

## 2019-08-02 LAB — COMPREHENSIVE METABOLIC PANEL
ALT: 10 IU/L (ref 0–32)
AST: 17 IU/L (ref 0–40)
Albumin/Globulin Ratio: 2 (ref 1.2–2.2)
Albumin: 4.7 g/dL (ref 3.8–4.8)
Alkaline Phosphatase: 54 IU/L (ref 39–117)
BUN/Creatinine Ratio: 9 (ref 9–23)
BUN: 11 mg/dL (ref 6–20)
Bilirubin Total: 0.3 mg/dL (ref 0.0–1.2)
CO2: 24 mmol/L (ref 20–29)
Calcium: 9.6 mg/dL (ref 8.7–10.2)
Chloride: 101 mmol/L (ref 96–106)
Creatinine, Ser: 1.29 mg/dL — ABNORMAL HIGH (ref 0.57–1.00)
GFR calc Af Amer: 61 mL/min/{1.73_m2} (ref >59)
GFR calc non Af Amer: 53 mL/min/{1.73_m2} — ABNORMAL LOW (ref >59)
Globulin, Total: 2.4 g/dL (ref 1.5–4.5)
Glucose: 91 mg/dL (ref 65–99)
Potassium: 4.2 mmol/L (ref 3.5–5.2)
Sodium: 138 mmol/L (ref 134–144)
Total Protein: 7.1 g/dL (ref 6.0–8.5)

## 2019-08-02 LAB — TSH: TSH: 1.49 u[IU]/mL (ref 0.450–4.500)

## 2019-08-02 LAB — VITAMIN D 25 HYDROXY (VIT D DEFICIENCY, FRACTURES): Vit D, 25-Hydroxy: 25 ng/mL — ABNORMAL LOW (ref 30.0–100.0)

## 2019-08-02 NOTE — Telephone Encounter (Signed)
-----   Message from Donnamae Jude, MD sent at 08/02/2019  8:30 AM EDT ----- Labs are good, except kidney function is a little off--maybe dehydrated or taking a lot of NSAIDS--re check in 4 wks. Also ask her to start some oscal for her low vitamin D--OsCal + D bid. It is over-the-counter.

## 2019-08-02 NOTE — Telephone Encounter (Signed)
Left message for pt to call back to go over results.

## 2019-08-02 NOTE — Telephone Encounter (Signed)
Pt informed of lab results and recommendation from Dr Kennon Rounds. Pt verbalizes.

## 2019-12-09 ENCOUNTER — Other Ambulatory Visit: Payer: Self-pay

## 2019-12-09 ENCOUNTER — Encounter: Payer: Self-pay | Admitting: Family Medicine

## 2019-12-09 ENCOUNTER — Other Ambulatory Visit: Payer: BC Managed Care – PPO

## 2019-12-09 ENCOUNTER — Telehealth (INDEPENDENT_AMBULATORY_CARE_PROVIDER_SITE_OTHER): Payer: BC Managed Care – PPO | Admitting: Family Medicine

## 2019-12-09 DIAGNOSIS — N289 Disorder of kidney and ureter, unspecified: Secondary | ICD-10-CM

## 2019-12-09 NOTE — Progress Notes (Signed)
    TELEHEALTH GYNECOLOGY VIRTUAL VIDEO VISIT ENCOUNTER NOTE  Provider location: [facility]   I connected with [name] on 12/09/19 at  1:00 PM EST by Elsa Encounter at home and verified that I am speaking with the correct person using two identifiers.   I discussed the limitations, risks, security and privacy concerns of performing an evaluation and management service virtually and the availability of in person appointments. I also discussed with the patient that there may be a patient responsible charge related to this service. The patient expressed understanding and agreed to proceed.   History:  Toni HELMLE is a 39 y.o. G2P2 female being evaluated today for f/u lab. Seen for annual 9/20 with some mild renal dysfunction. Needs f/u. Taste and smell off. But not COVID related. Thought it was a migraine. Took COVID test and it was negative. Certain foods seem disgusting to her.  Denies change in urinary symptoms and denies fatigue. She denies any abnormal vaginal discharge, bleeding, pelvic pain or other concerns.       Past Medical History:  Diagnosis Date  . Allergy    seasonal  . Chronic headaches   . Epilepsy ([place])   . Seizures ([place])    Past Surgical History:  Procedure Laterality Date  . CESAREAN SECTION     x2  . COLONOSCOPY  2014  . IRRIGATION AND DEBRIDEMENT SEBACEOUS CYST  1985  . [place] EXTRACTION  2001   The following portions of the patient's history were reviewed and updated as appropriate: allergies, current medications, past family history, past medical history, past social history, past surgical history and problem list.   Health Maintenance:  Normal pap and negative HRHPV on 09/04/2018.    Review of Systems:  Pertinent items noted in HPI and remainder of comprehensive ROS otherwise negative.  Physical Exam:   General:  Alert, oriented and cooperative. Patient appears to be in no acute distress.  Mental  Status: Normal mood and affect. Normal behavior. Normal judgment and thought content.   Respiratory: Normal respiratory effort, no problems with respiration noted  Rest of physical exam deferred due to type of encounter  Labs and Imaging No results found for this or any previous visit (from the past 336 hour(s)). No results found.     Assessment and Plan:     1. Abnormal renal function Repeat renal function and refer or reassure accordingly. - Basic metabolic panel; Future    I discussed the assessment and treatment plan with the patient. The patient was provided an opportunity to ask questions and all were answered. The patient agreed with the plan and demonstrated an understanding of the instructions.   The patient was advised to call back or seek an in-person evaluation/go to the ED if the symptoms worsen or if the condition fails to improve as anticipated.  I provided 11 minutes of face-to-face time during this encounter.   Donnamae Jude, MD [facility], [place]

## 2019-12-09 NOTE — Progress Notes (Signed)
Would like to discuss lab results from 07/2019  I connected with  [name] on 12/09/19 at  1:00 PM EST by telephone and verified that I am speaking with the correct person using two identifiers.   I discussed the limitations, risks, security and privacy concerns of performing an evaluation and management service by telephone and the availability of in person appointments. I also discussed with the patient that there may be a patient responsible charge related to this service. The patient expressed understanding and agreed to proceed.  Crosby Oyster, RN 12/09/2019  1:03 PM

## 2019-12-10 LAB — BASIC METABOLIC PANEL
BUN/Creatinine Ratio: 11 (ref 9–23)
BUN: 10 mg/dL (ref 6–20)
CO2: 25 mmol/L (ref 20–29)
Calcium: 9.9 mg/dL (ref 8.7–10.2)
Chloride: 102 mmol/L (ref 96–106)
Creatinine, Ser: 0.93 mg/dL (ref 0.57–1.00)
GFR calc Af Amer: 90 mL/min/{1.73_m2} (ref >59)
GFR calc non Af Amer: 78 mL/min/{1.73_m2} (ref >59)
Glucose: 120 mg/dL — ABNORMAL HIGH (ref 65–99)
Potassium: 3.9 mmol/L (ref 3.5–5.2)
Sodium: 141 mmol/L (ref 134–144)

## 2020-03-06 ENCOUNTER — Encounter: Payer: Self-pay | Admitting: Gastroenterology

## 2020-03-06 ENCOUNTER — Ambulatory Visit (INDEPENDENT_AMBULATORY_CARE_PROVIDER_SITE_OTHER): Payer: Self-pay | Admitting: Gastroenterology

## 2020-03-06 VITALS — BP 100/60 | HR 84 | Temp 98.3°F | Ht 66.75 in | Wt 172.4 lb

## 2020-03-06 DIAGNOSIS — K625 Hemorrhage of anus and rectum: Secondary | ICD-10-CM

## 2020-03-06 DIAGNOSIS — K648 Other hemorrhoids: Secondary | ICD-10-CM | POA: Insufficient documentation

## 2020-03-06 MED ORDER — HYDROCORTISONE ACETATE 25 MG RE SUPP: 25.0000 mg | Freq: Every day | RECTAL | 1 refills | Status: DC

## 2020-03-06 NOTE — Progress Notes (Signed)
     03/06/2020 [name] [id number] [birth year]   HISTORY OF PRESENT ILLNESS:  This is a 39 year old female who is a patient of Dr. Woodward Ku who is here today with complaints of rectal bleeding.  She had a colonoscopy in 10/2016 at which time she was found to have internal hemorrhoids and one small polyp that was removed and was a tubular adenoma on pathology.  She reports intermittent bright red blood, small volume on the toilet paper and in the toilet for 2-3 days about 2 weeks ago.  She says that she moves her bowels regularly without issues.  No bleeding since that episode 2 weeks ago.  Had complaints of rectal bleeding in the past and hemorrhoids thought to be the cause of that complaint after colonoscopy assessment.   Past Medical History:  Diagnosis Date  . Allergy    seasonal  . Chronic headaches   . Colon polyps   . Epilepsy ([place])   . Seizures ([place])    Past Surgical History:  Procedure Laterality Date  . CESAREAN SECTION  2005, 2010   x2  . COLONOSCOPY  2014  . IRRIGATION AND DEBRIDEMENT SEBACEOUS CYST  1985  . WISDOM TOOTH EXTRACTION  2001    reports that she has never smoked. She has never used smokeless tobacco. She reports current alcohol use. She reports that she does not use drugs. family history includes Heart disease in her paternal grandmother; Lung cancer in her maternal grandfather. Allergies  Allergen Reactions  . Tegretol [Carbamazepine] Hives      Outpatient Encounter Medications as of 03/06/2020  Medication Sig  . fexofenadine (ALLEGRA ALLERGY) 180 MG tablet Take 1 tablet (180 mg total) by mouth daily.  Marland Kitchen levETIRAcetam (KEPPRA) 750 MG tablet Take 2,250 mg by mouth daily.   Marland Kitchen levonorgestrel (LILETTA, 52 MG,) 19.5 MCG/DAY IUD IUD 1 Intra Uterine Device by Intrauterine route once.   Facility-Administered Encounter Medications as of 03/06/2020  Medication  . 0.9 %  sodium chloride infusion     REVIEW OF SYSTEMS  : All other systems reviewed and  negative except where noted in the History of Present Illness.   PHYSICAL EXAM: BP 100/60 (BP Location: Left Arm, Patient Position: Sitting, Cuff Size: Normal)   Pulse 84   Temp 98.3 F (36.8 C)   Ht 5' 6.75" (1.695 m) Comment: height measured without shoes  Wt 172 lb 6 oz (78.2 kg)   BMI 27.20 kg/m  General: Well developed white female in no acute distress Head: Normocephalic and atraumatic Eyes:  Sclerae anicteric, conjunctiva pink. Ears: Normal auditory acuity Lungs: Clear throughout to auscultation; no increased WOB. Heart: Regular rate and rhythm; no M/R/G. Abdomen: Soft, non-distended.  BS present.  Non-tender. Rectal:  No external abnormalities noted.  DRE revealed no masses, small amount of brown stool on exam glove.  Anoscopy revealed small internal hemorrhoids, non-bleeding. Musculoskeletal: Symmetrical with no gross deformities  Skin: No lesions on visible extremities Extremities: No edema  Neurological: Alert oriented x 4, grossly non-focal Psychological:  Alert and cooperative. Normal mood and affect  ASSESSMENT AND PLAN: *Rectal bleeding, transient small volume:  Likely due to internal hemorrhoids that were seen on previous colonoscopy and exam today.  No bleeding recently.  Will give prescription for hydrocortisone suppositories to use if needed.   *Personal history of colon polyps:  Adenomatous polyp removed in 10/2016.  Repeat recommended 10/2021.   CC:  Donnamae Jude, MD

## 2020-03-06 NOTE — Patient Instructions (Addendum)
If you are age 38 or older, your body mass index should be between 23-30. Your Body mass index is 27.2 kg/m. If this is out of the aforementioned range listed, please consider follow up with your Primary Care Provider.  If you are age 69 or younger, your body mass index should be between 19-25. Your Body mass index is 27.2 kg/m. If this is out of the aformentioned range listed, please consider follow up with your Primary Care Provider.   We have sent the following medications to your pharmacy for you to pick up at your convenience: Hydrocortisone suppositories nightly for 7 days.

## 2020-03-10 NOTE — Progress Notes (Signed)
If continue to have persistent rectal bleeding, will consider flex sig to exclude rectal neoplastic lesion. H/o adenomatous polyps starting age 38.   Reviewed and agree with documentation and assessment and plan. Damaris Hippo , MD

## 2020-06-09 ENCOUNTER — Encounter: Payer: Self-pay | Admitting: Radiology

## 2021-05-25 ENCOUNTER — Other Ambulatory Visit (HOSPITAL_COMMUNITY)
Admission: RE | Admit: 2021-05-25 | Discharge: 2021-05-25 | Disposition: A | Discharge status: Home / Self Care | Payer: Commercial Managed Care - PPO | Source: Ambulatory Visit | Attending: Family Medicine | Admitting: Family Medicine

## 2021-05-25 ENCOUNTER — Encounter: Payer: Self-pay | Admitting: Family Medicine

## 2021-05-25 ENCOUNTER — Other Ambulatory Visit: Payer: Self-pay

## 2021-05-25 ENCOUNTER — Ambulatory Visit (INDEPENDENT_AMBULATORY_CARE_PROVIDER_SITE_OTHER): Payer: Commercial Managed Care - PPO | Admitting: Family Medicine

## 2021-05-25 VITALS — BP 124/77 | HR 69 | Ht 67.0 in | Wt 176.4 lb

## 2021-05-25 DIAGNOSIS — Z01411 Encounter for gynecological examination (general) (routine) with abnormal findings: Secondary | ICD-10-CM

## 2021-05-25 DIAGNOSIS — Z124 Encounter for screening for malignant neoplasm of cervix: Secondary | ICD-10-CM | POA: Diagnosis not present

## 2021-05-25 DIAGNOSIS — Z30432 Encounter for removal of intrauterine contraceptive device: Secondary | ICD-10-CM | POA: Diagnosis not present

## 2021-05-25 DIAGNOSIS — Z01419 Encounter for gynecological examination (general) (routine) without abnormal findings: Secondary | ICD-10-CM | POA: Diagnosis not present

## 2021-05-25 NOTE — Patient Instructions (Signed)
Preventive Care 21-39 Years Old, Female Preventive care refers to lifestyle choices and visits with your health care provider that can promote health and wellness. This includes: A yearly physical exam. This is also called an annual wellness visit. Regular dental and eye exams. Immunizations. Screening for certain conditions. Healthy lifestyle choices, such as: Eating a healthy diet. Getting regular exercise. Not using drugs or products that contain nicotine and tobacco. Limiting alcohol use. What can I expect for my preventive care visit? Physical exam Your health care provider may check your: Height and weight. These may be used to calculate your BMI (body mass index). BMI is a measurement that tells if you are at a healthy weight. Heart rate and blood pressure. Body temperature. Skin for abnormal spots. Counseling Your health care provider may ask you questions about your: Past medical problems. Family's medical history. Alcohol, tobacco, and drug use. Emotional well-being. Home life and relationship well-being. Sexual activity. Diet, exercise, and sleep habits. Work and work environment. Access to firearms. Method of birth control. Menstrual cycle. Pregnancy history. What immunizations do I need?  Vaccines are usually given at various ages, according to a schedule. Your health care provider will recommend vaccines for you based on your age, medicalhistory, and lifestyle or other factors, such as travel or where you work. What tests do I need?  Blood tests Lipid and cholesterol levels. These may be checked every 5 years starting at age 20. Hepatitis C test. Hepatitis B test. Screening Diabetes screening. This is done by checking your blood sugar (glucose) after you have not eaten for a while (fasting). STD (sexually transmitted disease) testing, if you are at risk. BRCA-related cancer screening. This may be done if you have a family history of breast, ovarian, tubal, or  peritoneal cancers. Pelvic exam and Pap test. This may be done every 3 years starting at age 21. Starting at age 30, this may be done every 5 years if you have a Pap test in combination with an HPV test. Talk with your health care provider about your test results, treatment options,and if necessary, the need for more tests. Follow these instructions at home: Eating and drinking  Eat a healthy diet that includes fresh fruits and vegetables, whole grains, lean protein, and low-fat dairy products. Take vitamin and mineral supplements as recommended by your health care provider. Do not drink alcohol if: Your health care provider tells you not to drink. You are pregnant, may be pregnant, or are planning to become pregnant. If you drink alcohol: Limit how much you have to 0-1 drink a day. Be aware of how much alcohol is in your drink. In the [place] one drink equals one 12 oz bottle of beer (355 mL), one 5 oz glass of wine (148 mL), or one 1 oz glass of hard liquor (44 mL).  Lifestyle Take daily care of your teeth and gums. Brush your teeth every morning and night with fluoride toothpaste. Floss one time each day. Stay active. Exercise for at least 30 minutes 5 or more days each week. Do not use any products that contain nicotine or tobacco, such as cigarettes, e-cigarettes, and chewing tobacco. If you need help quitting, ask your health care provider. Do not use drugs. If you are sexually active, practice safe sex. Use a condom or other form of protection to prevent STIs (sexually transmitted infections). If you do not wish to become pregnant, use a form of birth control. If you plan to become pregnant, see your health care   provider for a prepregnancy visit. Find healthy ways to cope with stress, such as: Meditation, yoga, or listening to music. Journaling. Talking to a trusted person. Spending time with friends and family. Safety Always wear your seat belt while driving or riding in a  vehicle. Do not drive: If you have been drinking alcohol. Do not ride with someone who has been drinking. When you are tired or distracted. While texting. Wear a helmet and other protective equipment during sports activities. If you have firearms in your house, make sure you follow all gun safety procedures. Seek help if you have been physically or sexually abused. What's next? Go to your health care provider once a year for an annual wellness visit. Ask your health care provider how often you should have your eyes and teeth checked. Stay up to date on all vaccines. This information is not intended to replace advice given to you by your health care provider. Make sure you discuss any questions you have with your healthcare provider. Document Revised: 07/05/2020 Document Reviewed: 07/19/2018 Elsevier Patient Education  2022 [employer].

## 2021-05-25 NOTE — Progress Notes (Signed)
Subjective:     [name] is a 40 y.o. female and is here for a comprehensive physical exam. The patient reports problems - with IUD . Not happy with the [place] IUD. She had no bleeding with Mirena, and then had some bleeding when switched. Now having some abnormal bleeding. Having more pain with it and it affects her mood. She feels depressed and sad. Has decreased libido.   The following portions of the patient's history were reviewed and updated as appropriate: allergies, current medications, past family history, past medical history, past social history, past surgical history, and problem list.  Review of Systems Pertinent items noted in HPI and remainder of comprehensive ROS otherwise negative.   Objective:    BP 124/77   Pulse 69   Ht 5\' 7"  (1.702 m)   Wt 176 lb 6.4 oz (80 kg)   LMP 05/15/2021 (Approximate)   BMI 27.63 kg/m  General appearance: alert, cooperative, and appears stated age Head: Normocephalic, without obvious abnormality, atraumatic Neck: no adenopathy, supple, symmetrical, trachea midline, and thyroid not enlarged, symmetric, no tenderness/mass/nodules Lungs: clear to auscultation bilaterally Breasts: normal appearance, no masses or tenderness Heart: regular rate and rhythm, S1, S2 normal, no murmur, click, rub or gallop Abdomen: soft, non-tender; bowel sounds normal; no masses,  no organomegaly Pelvic: cervix normal in appearance, external genitalia normal, no adnexal masses or tenderness, no cervical motion tenderness, uterus normal size, shape, and consistency, and vagina normal without discharge Extremities: extremities normal, atraumatic, no cyanosis or edema Pulses: 2+ and symmetric Skin: Skin color, texture, turgor normal. No rashes or lesions Lymph nodes: Cervical, supraclavicular, and axillary nodes normal. Neurologic: Grossly normal    Procedure: Speculum placed inside vagina.  Cervix visualized.  Strings grasped with ring forceps.  IUD removed  intact.  Assessment:    Healthy female exam.      Plan:  Encounter for gynecological examination with abnormal finding  Screening for malignant neoplasm of cervix - Plan: Cytology - PAP( [place])  Encounter for IUD removal - will leave out for 2 months, use condoms. Consider Mirena replacement, Imaging if needed, vasectomy.  Return in 2 months (on 07/26/2021) for a follow-up.    See After Visit Summary for Counseling Recommendations

## 2021-05-25 NOTE — Progress Notes (Signed)
Switched to [employer] from [place]. Started having periods. Now having 2 periods/ month. Experiencing a lot of back pain. Reports changes in mood.

## 2021-05-28 LAB — CYTOLOGY - PAP
Comment: NEGATIVE
Diagnosis: NEGATIVE
High risk HPV: NEGATIVE

## 2021-06-29 ENCOUNTER — Telehealth: Payer: Self-pay | Admitting: Radiology

## 2021-06-29 NOTE — Telephone Encounter (Signed)
Left message to call [facility] to schedule gyn f/u appt with Dr Kennon Rounds

## 2021-08-26 ENCOUNTER — Ambulatory Visit: Payer: Commercial Managed Care - PPO | Admitting: Family Medicine

## 2021-09-03 DIAGNOSIS — G40309 Generalized idiopathic epilepsy and epileptic syndromes, not intractable, without status epilepticus: Secondary | ICD-10-CM | POA: Diagnosis not present

## 2021-09-03 DIAGNOSIS — G43809 Other migraine, not intractable, without status migrainosus: Secondary | ICD-10-CM | POA: Diagnosis not present

## 2021-12-15 ENCOUNTER — Encounter: Payer: Self-pay | Admitting: Gastroenterology

## 2022-01-14 ENCOUNTER — Ambulatory Visit (AMBULATORY_SURGERY_CENTER): Payer: BC Managed Care – PPO

## 2022-01-14 ENCOUNTER — Other Ambulatory Visit: Payer: Self-pay

## 2022-01-14 VITALS — Ht 67.0 in | Wt 175.0 lb

## 2022-01-14 DIAGNOSIS — Z86010 Personal history of colonic polyps: Secondary | ICD-10-CM

## 2022-01-14 MED ORDER — NA SULFATE-K SULFATE-MG SULF 17.5-3.13-1.6 GM/177ML PO SOLN: 1.0000 | Freq: Once | ORAL | 0 refills | Status: AC

## 2022-01-14 NOTE — Progress Notes (Signed)
No egg or soy allergy known to patient  No issues known to pt with past sedation with any surgeries or procedures Patient denies ever being told they had issues or difficulty with intubation  No FH of Malignant Hyperthermia Pt is not on diet pills Pt is not on home 02  Pt is not on blood thinners  Pt denies issues with constipation;  No A fib or A flutter NO PA's for preps discussed with pt in PV today  Discussed with pt there will be an out-of-pocket cost for prep and that varies from $0 to 70 + dollars - pt verbalized understanding  Due to the COVID-19 pandemic we are asking patients to follow certain guidelines in PV and the [facility]   Pt aware of COVID protocols and [facility] guidelines  PV completed over the phone. Pt verified name, DOB, address and insurance during PV today.  Pt mailed instruction packet with copy of consent form to read and not return, and instructions.  Pt encouraged to call with questions or issues.  If pt has My chart, procedure instructions sent via My Chart

## 2022-01-20 ENCOUNTER — Encounter: Payer: Self-pay | Admitting: Gastroenterology

## 2022-01-24 ENCOUNTER — Encounter: Payer: Self-pay | Admitting: Gastroenterology

## 2022-01-24 MED ORDER — SUTAB 1479-225-188 MG PO TABS: 1.0000 | ORAL_TABLET | ORAL | 0 refills | Status: DC

## 2022-01-28 ENCOUNTER — Ambulatory Visit (AMBULATORY_SURGERY_CENTER): Payer: BC Managed Care – PPO | Admitting: Gastroenterology

## 2022-01-28 ENCOUNTER — Other Ambulatory Visit: Payer: Self-pay

## 2022-01-28 ENCOUNTER — Encounter: Payer: Self-pay | Admitting: Gastroenterology

## 2022-01-28 VITALS — BP 101/69 | HR 71 | Temp 97.8°F | Resp 12 | Ht 67.0 in | Wt 175.0 lb

## 2022-01-28 DIAGNOSIS — D122 Benign neoplasm of ascending colon: Secondary | ICD-10-CM | POA: Diagnosis not present

## 2022-01-28 DIAGNOSIS — Z1211 Encounter for screening for malignant neoplasm of colon: Secondary | ICD-10-CM | POA: Diagnosis not present

## 2022-01-28 DIAGNOSIS — Z86010 Personal history of colonic polyps: Secondary | ICD-10-CM

## 2022-01-28 DIAGNOSIS — D125 Benign neoplasm of sigmoid colon: Secondary | ICD-10-CM

## 2022-01-28 MED ORDER — SODIUM CHLORIDE 0.9 % IV SOLN: 500.0000 mL | Freq: Once | INTRAVENOUS | Status: DC

## 2022-01-28 NOTE — Patient Instructions (Signed)
YOU HAD AN ENDOSCOPIC PROCEDURE TODAY AT THE [facility]:   Refer to the procedure report that was given to you for any specific questions about what was found during the examination.  If the procedure report does not answer your questions, please call your gastroenterologist to clarify.  If you requested that your care partner not be given the details of your procedure findings, then the procedure report has been included in a sealed envelope for you to review at your convenience later.  YOU SHOULD EXPECT: Some feelings of bloating in the abdomen. Passage of more gas than usual.  Walking can help get rid of the air that was put into your GI tract during the procedure and reduce the bloating. If you had a lower endoscopy (such as a colonoscopy or flexible sigmoidoscopy) you may notice spotting of blood in your stool or on the toilet paper. If you underwent a bowel prep for your procedure, you may not have a normal bowel movement for a few days.  Please Note:  You might notice some irritation and congestion in your nose or some drainage.  This is from the oxygen used during your procedure.  There is no need for concern and it should clear up in a day or so.  SYMPTOMS TO REPORT IMMEDIATELY:   Following lower endoscopy (colonoscopy or flexible sigmoidoscopy):  Excessive amounts of blood in the stool  Significant tenderness or worsening of abdominal pains  Swelling of the abdomen that is new, acute  Fever of 100F or higher  For urgent or emergent issues, a gastroenterologist can be reached at any hour by calling [phone]. Do not use MyChart messaging for urgent concerns.    DIET:  We do recommend a small meal at first, but then you may proceed to your regular diet.  Drink plenty of fluids but you should avoid alcoholic beverages for 24 hours.  ACTIVITY:  You should plan to take it easy for the rest of today and you should NOT DRIVE or use heavy machinery until tomorrow (because  of the sedation medicines used during the test).    FOLLOW UP: Our staff will call the number listed on your records 48-72 hours following your procedure to check on you and address any questions or concerns that you may have regarding the information given to you following your procedure. If we do not reach you, we will leave a message.  We will attempt to reach you two times.  During this call, we will ask if you have developed any symptoms of COVID 19. If you develop any symptoms (ie: fever, flu-like symptoms, shortness of breath, cough etc.) before then, please call [phone].  If you test positive for Covid 19 in the 2 weeks post procedure, please call and report this information to us.    If any biopsies were taken you will be contacted by phone or by letter within the next 1-3 weeks.  Please call us at [phone] if you have not heard about the biopsies in 3 weeks.    SIGNATURES/CONFIDENTIALITY: You and/or your care partner have signed paperwork which will be entered into your electronic medical record.  These signatures attest to the fact that that the information above on your After Visit Summary has been reviewed and is understood.  Full responsibility of the confidentiality of this discharge information lies with you and/or your care-partner. 

## 2022-01-28 NOTE — Progress Notes (Signed)
Called to room to assist during endoscopic procedure.  Patient ID and intended procedure confirmed with present staff. Received instructions for my participation in the procedure from the performing physician.  

## 2022-01-28 NOTE — Progress Notes (Signed)
Vitals-DT  Pt's states no medical or surgical changes since previsit or office visit.  

## 2022-01-28 NOTE — Progress Notes (Signed)
PT taken to PACU. Monitors in place. VSS. Report given to RN. 

## 2022-01-28 NOTE — Op Note (Signed)
[facility] ?Patient Name: [name] ?Procedure Date: 01/28/2022 10:26 AM ?MRN: [id number] ?Endoscopist: Mauri Pole , MD ?Age: 41 ?Referring MD:  ?Date of Birth: 09/15/1981 ?Gender: Female ?Account #: [id number] ?Procedure:                Colonoscopy ?Indications:              High risk colon cancer surveillance: Personal  ?                          history of colonic polyps, High risk colon cancer  ?                          surveillance: Personal history of adenoma less than  ?                          10 mm in size ?Medicines:                Monitored Anesthesia Care ?Procedure:                Pre-Anesthesia Assessment: ?                          - Prior to the procedure, a History and Physical  ?                          was performed, and patient medications and  ?                          allergies were reviewed. The patient's tolerance of  ?                          previous anesthesia was also reviewed. The risks  ?                          and benefits of the procedure and the sedation  ?                          options and risks were discussed with the patient.  ?                          All questions were answered, and informed consent  ?                          was obtained. Prior Anticoagulants: The patient has  ?                          taken no previous anticoagulant or antiplatelet  ?                          agents. ASA Grade Assessment: II - A patient with  ?                          mild systemic disease. After reviewing the risks  ?  and benefits, the patient was deemed in  ?                          satisfactory condition to undergo the procedure. ?                          After obtaining informed consent, the colonoscope  ?                          was passed under direct vision. Throughout the  ?                          procedure, the patient's blood pressure, pulse, and  ?                          oxygen saturations were monitored continuously.  The  ?                          Olympus PCF-H190DL (#[id number]) Colonoscope was  ?                          introduced through the anus and advanced to the the  ?                          cecum, identified by appendiceal orifice and  ?                          ileocecal valve. The colonoscopy was performed  ?                          without difficulty. The patient tolerated the  ?                          procedure well. The quality of the bowel  ?                          preparation was excellent. The ileocecal valve,  ?                          appendiceal orifice, and rectum were photographed. ?Scope In: 10:29:25 AM ?Scope Out: 10:52:58 AM ?Scope Withdrawal Time: 0 hours 15 minutes 42 seconds  ?Total Procedure Duration: 0 hours 23 minutes 33 seconds  ?Findings:                 The perianal and digital rectal examinations were  ?                          normal. ?                          Two sessile polyps were found in the sigmoid colon  ?                          and ascending colon. The polyps were 4 to 5 mm in  ?  size. These polyps were removed with a cold snare.  ?                          Resection and retrieval were complete. ?                          Non-bleeding external and internal hemorrhoids were  ?                          found during retroflexion. The hemorrhoids were  ?                          small. ?Complications:            No immediate complications. ?Estimated Blood Loss:     Estimated blood loss was minimal. ?Impression:               - Two 4 to 5 mm polyps in the sigmoid colon and in  ?                          the ascending colon, removed with a cold snare.  ?                          Resected and retrieved. ?                          - Non-bleeding external and internal hemorrhoids. ?Recommendation:           - Patient has a contact number available for  ?                          emergencies. The signs and symptoms of potential  ?                           delayed complications were discussed with the  ?                          patient. Return to normal activities tomorrow.  ?                          Written discharge instructions were provided to the  ?                          patient. ?                          - Resume previous diet. ?                          - Continue present medications. ?                          - Await pathology results. ?                          - Repeat colonoscopy in 5-10 years for surveillance  ?  based on pathology results. ?Mauri Pole, MD ?01/28/2022 11:00:15 AM ?This report has been signed electronically. ?

## 2022-01-28 NOTE — Progress Notes (Signed)
[facility] History and Physical ? ? ?Primary Care Physician:  Donnamae Jude, MD ? ? ?Reason for Procedure:  History of adenomatous colon polyps ? ?Plan:    Surveillance colonoscopy with possible interventions as needed ? ? ? ? ?HPI: [name] is a very pleasant 41 y.o. female here for surveillance colonoscopy. ?Denies any nausea, vomiting, abdominal pain, melena or bright red blood per rectum ? ?The risks and benefits as well as alternatives of endoscopic procedure(s) have been discussed and reviewed. All questions answered. The patient agrees to proceed. ? ? ? ?Past Medical History:  ?Diagnosis Date  ? Chronic headaches   ? Colon polyps   ? Epilepsy ([place])   ? last episode  August 2020  ? Seasonal allergies   ? Seizures ([place])   ? ? ?Past Surgical History:  ?Procedure Laterality Date  ? CESAREAN SECTION  2005, 2010  ? x2  ? COLONOSCOPY  2014  ? COLONOSCOPY  2017  ? KN-MAC-suprep(exc)-TA x 1  ? IRRIGATION AND DEBRIDEMENT SEBACEOUS CYST  1985  ? POLYPECTOMY  2017  ? TA  ? [place] EXTRACTION  2001  ? ? ?Prior to Admission medications   ?Medication Sig Start Date End Date Taking? Authorizing Provider  ?Cholecalciferol (VITAMIN D3 PO) Take 1 tablet by mouth daily at 6 (six) AM.   Yes [provider]  ?fexofenadine (ALLEGRA ALLERGY) 180 MG tablet Take 1 tablet (180 mg total) by mouth daily. 08/22/17  Yes Donnamae Jude, MD  ?levETIRAcetam (KEPPRA) 750 MG tablet Take 2,250 mg by mouth daily.    Yes [provider]  ?hydrocortisone (ANUSOL-HC) 25 MG suppository Place 1 suppository (25 mg total) rectally at bedtime. ?Patient not taking: Reported on 05/25/2021 03/06/20   Loralie Champagne, PA-C  ? ? ?Current Outpatient Medications  ?Medication Sig Dispense Refill  ? Cholecalciferol (VITAMIN D3 PO) Take 1 tablet by mouth daily at 6 (six) AM.    ? fexofenadine (ALLEGRA ALLERGY) 180 MG tablet Take 1 tablet (180 mg total) by mouth daily. 90 tablet 5  ? levETIRAcetam (KEPPRA) 750 MG tablet Take  2,250 mg by mouth daily.     ? hydrocortisone (ANUSOL-HC) 25 MG suppository Place 1 suppository (25 mg total) rectally at bedtime. (Patient not taking: Reported on 05/25/2021) 7 suppository 1  ? ?Current Facility-Administered Medications  ?Medication Dose Route Frequency Provider Last Rate Last Admin  ? 0.9 %  sodium chloride infusion  500 mL Intravenous Once Dominie Benedick, Venia Minks, MD      ? ? ?Allergies as of 01/28/2022 - Review Complete 01/28/2022  ?Allergen Reaction Noted  ? Tegretol [carbamazepine] Hives 01/26/2012  ? ? ?Family History  ?Problem Relation Age of Onset  ? Lung cancer Maternal Grandfather   ? Heart disease Paternal Grandmother   ? Colon cancer Neg Hx   ? Esophageal cancer Neg Hx   ? Stomach cancer Neg Hx   ? Rectal cancer Neg Hx   ? ? ?Social History  ? ?Socioeconomic History  ? Marital status: Married  ?  Spouse name: Not on file  ? Number of children: 2  ? Years of education: Not on file  ? Highest education level: Not on file  ?Occupational History  ? Occupation: [employer]  ?  Employer: [employer]  ?Tobacco Use  ? Smoking status: Never  ? Smokeless tobacco: Never  ?Vaping Use  ? Vaping Use: Never used  ?Substance and Sexual Activity  ? Alcohol use: Yes  ?  Alcohol/week: 7.0  standard drinks  ?  Types: 7 Standard drinks or equivalent per week  ?  Comment: 1 per day  ? Drug use: No  ? Sexual activity: Yes  ?  Partners: Male  ?  Birth control/protection: I.U.D.  ?Other Topics Concern  ? Not on file  ?Social History Narrative  ? Not on file  ? ?Social Determinants of Health  ? ?Financial Resource Strain: Not on file  ?Food Insecurity: Not on file  ?Transportation Needs: Not on file  ?Physical Activity: Not on file  ?Stress: Not on file  ?Social Connections: Not on file  ?Intimate Partner Violence: Not on file  ? ? ?Review of Systems: ? ?All other review of systems negative except as mentioned in the HPI. ? ?Physical Exam: ?Vital signs in last 24 hours: ?BP (!) 106/55 (BP Location: Right Arm, Patient  Position: Sitting, Cuff Size: Normal)   Pulse 83   Temp 97.8 ?F (36.6 ?C) (Temporal)   Ht _0  (1.702 m)   Wt 175 lb (79.4 kg)   LMP 01/10/2022   SpO2 98%   BMI 27.41 kg/m?  ?General:   Alert, NAD ?Lungs:  Clear .   ?Heart:  Regular rate and rhythm ?Abdomen:  Soft, nontender and nondistended. ?Neuro/Psych:  Alert and cooperative. Normal mood and affect. A and O x 3 ? ?Reviewed labs, radiology imaging, old records and pertinent past GI work up ? ?Patient is appropriate for planned procedure(s) and anesthesia in an ambulatory setting ? ? ?K. Denzil Magnuson , MD ?[phone]  ? ? ?  ?

## 2022-02-01 ENCOUNTER — Telehealth: Payer: Self-pay | Admitting: *Deleted

## 2022-02-01 ENCOUNTER — Telehealth: Payer: Self-pay

## 2022-02-01 NOTE — Telephone Encounter (Signed)
?  Follow up Call- ? ?Call back number 01/28/2022  ?Post procedure Call Back phone  # [phone]  ?Permission to leave phone message Yes  ?Some recent data might be hidden  ? LMOM to call back with any questions or concerns.  Also, call back if patient has developed fever, respiratory issues or been dx with COVID or had any family members or close contacts diagnosed since her procedure.  ?

## 2022-02-01 NOTE — Telephone Encounter (Signed)
Left message on follow up call. 

## 2022-02-03 ENCOUNTER — Encounter: Payer: Self-pay | Admitting: Gastroenterology

## 2022-06-22 ENCOUNTER — Encounter: Payer: Self-pay | Admitting: Family Medicine

## 2022-06-22 ENCOUNTER — Ambulatory Visit (INDEPENDENT_AMBULATORY_CARE_PROVIDER_SITE_OTHER): Payer: BC Managed Care – PPO | Admitting: Family Medicine

## 2022-06-22 VITALS — BP 119/76 | HR 75 | Ht 67.0 in | Wt 188.0 lb

## 2022-06-22 DIAGNOSIS — F32A Depression, unspecified: Secondary | ICD-10-CM | POA: Diagnosis not present

## 2022-06-22 DIAGNOSIS — Z01419 Encounter for gynecological examination (general) (routine) without abnormal findings: Secondary | ICD-10-CM

## 2022-06-22 DIAGNOSIS — Z1231 Encounter for screening mammogram for malignant neoplasm of breast: Secondary | ICD-10-CM

## 2022-06-22 DIAGNOSIS — F419 Anxiety disorder, unspecified: Secondary | ICD-10-CM

## 2022-06-22 NOTE — Assessment & Plan Note (Addendum)
99213 - Discussed possible treatment options to include mindfulness, yoga, self care, regular exercise, diet, therapy, medical treatment options. Discussed sleep hygiene. She will consider all of this, and begin to make herself a priority.

## 2022-06-22 NOTE — Progress Notes (Signed)
Subjective:     [name] is a 41 y.o. female and is here for a comprehensive physical exam. The patient reports problems - with sleep and not feeling like herself.  Having regular cycles, but they seem very light, ? Tapering off. Has bad cramping and bloating and diarrhea. Notes her mood has changed, more irritable or sad.feels tired and has trouble sleeping. Reports worrying more.      The following portions of the patient's history were reviewed and updated as appropriate: allergies, current medications, past family history, past medical history, past social history, past surgical history, and problem list.  Review of Systems Pertinent items noted in HPI and remainder of comprehensive ROS otherwise negative.   Objective:    BP 119/76   Pulse 75   Ht '5\' 7"'$  (1.702 m)   Wt 188 lb (85.3 kg)   BMI 29.44 kg/m  General appearance: alert, cooperative, and appears stated age Head: Normocephalic, without obvious abnormality, atraumatic Neck: no adenopathy, supple, symmetrical, trachea midline, and thyroid not enlarged, symmetric, no tenderness/mass/nodules Lungs: clear to auscultation bilaterally Breasts: normal appearance, no masses or tenderness Heart: regular rate and rhythm, S1, S2 normal, no murmur, click, rub or gallop Abdomen: soft, non-tender; bowel sounds normal; no masses,  no organomegaly Extremities: extremities normal, atraumatic, no cyanosis or edema Pulses: 2+ and symmetric Skin: Skin color, texture, turgor normal. No rashes or lesions Lymph nodes: Cervical, supraclavicular, and axillary nodes normal. Neurologic: Grossly normal    Assessment:    Healthy female exam.      Plan:   Problem List Items Addressed This Visit       Unprioritized   Anxiety and depression    99213 - Discussed possible treatment options to include mindfulness, yoga, self care, regular exercise, diet, therapy, medical treatment options. Discussed sleep hygiene. She will consider all of  this, and begin to make herself a priority.      Other Visit Diagnoses     Encounter for gynecological examination without abnormal finding    -  Primary   [phone]   Encounter for screening mammogram for malignant neoplasm of breast       [phone]   Relevant Orders   MM 3D SCREEN BREAST BILATERAL      Return in 6 weeks (on 08/03/2022) for a follow-up, virtual.    See After Visit Summary for Counseling Recommendations

## 2022-06-22 NOTE — Patient Instructions (Signed)
Preventive Care 41 Years Old, Female Preventive care refers to lifestyle choices and visits with your health care provider that can promote health and wellness. Preventive care visits are also called wellness exams. What can I expect for my preventive care visit? Counseling During your preventive care visit, your health care provider may ask about your: Medical history, including: Past medical problems. Family medical history. Pregnancy history. Current health, including: Menstrual cycle. Method of birth control. Emotional well-being. Home life and relationship well-being. Sexual activity and sexual health. Lifestyle, including: Alcohol, nicotine or tobacco, and drug use. Access to firearms. Diet, exercise, and sleep habits. Work and work environment. Sunscreen use. Safety issues such as seatbelt and bike helmet use. Physical exam Your health care provider may check your: Height and weight. These may be used to calculate your BMI (body mass index). BMI is a measurement that tells if you are at a healthy weight. Waist circumference. This measures the distance around your waistline. This measurement also tells if you are at a healthy weight and may help predict your risk of certain diseases, such as type 2 diabetes and high blood pressure. Heart rate and blood pressure. Body temperature. Skin for abnormal spots. What immunizations do I need?  Vaccines are usually given at various ages, according to a schedule. Your health care provider will recommend vaccines for you based on your age, medical history, and lifestyle or other factors, such as travel or where you work. What tests do I need? Screening Your health care provider may recommend screening tests for certain conditions. This may include: Pelvic exam and Pap test. Lipid and cholesterol levels. Diabetes screening. This is done by checking your blood sugar (glucose) after you have not eaten for a while (fasting). Hepatitis  B test. Hepatitis C test. HIV (human immunodeficiency virus) test. STI (sexually transmitted infection) testing, if you are at risk. BRCA-related cancer screening. This may be done if you have a family history of breast, ovarian, tubal, or peritoneal cancers. Talk with your health care provider about your test results, treatment options, and if necessary, the need for more tests. Follow these instructions at home: Eating and drinking  Eat a healthy diet that includes fresh fruits and vegetables, whole grains, lean protein, and low-fat dairy products. Take vitamin and mineral supplements as recommended by your health care provider. Do not drink alcohol if: Your health care provider tells you not to drink. You are pregnant, may be pregnant, or are planning to become pregnant. If you drink alcohol: Limit how much you have to 0-1 drink a day. Know how much alcohol is in your drink. In the [place] one drink equals one 12 oz bottle of beer (355 mL), one 5 oz glass of wine (148 mL), or one 1 oz glass of hard liquor (44 mL). Lifestyle Brush your teeth every morning and night with fluoride toothpaste. Floss one time each day. Exercise for at least 30 minutes 5 or more days each week. Do not use any products that contain nicotine or tobacco. These products include cigarettes, chewing tobacco, and vaping devices, such as e-cigarettes. If you need help quitting, ask your health care provider. Do not use drugs. If you are sexually active, practice safe sex. Use a condom or other form of protection to prevent STIs. If you do not wish to become pregnant, use a form of birth control. If you plan to become pregnant, see your health care provider for a prepregnancy visit. Find healthy ways to manage stress, such as: Meditation,   yoga, or listening to music. Journaling. Talking to a trusted person. Spending time with friends and family. Minimize exposure to UV radiation to reduce your risk of skin  cancer. Safety Always wear your seat belt while driving or riding in a vehicle. Do not drive: If you have been drinking alcohol. Do not ride with someone who has been drinking. If you have been using any mind-altering substances or drugs. While texting. When you are tired or distracted. Wear a helmet and other protective equipment during sports activities. If you have firearms in your house, make sure you follow all gun safety procedures. Seek help if you have been physically or sexually abused. What's next? Go to your health care provider once a year for an annual wellness visit. Ask your health care provider how often you should have your eyes and teeth checked. Stay up to date on all vaccines. This information is not intended to replace advice given to you by your health care provider. Make sure you discuss any questions you have with your health care provider. Document Revised: 05/05/2021 Document Reviewed: 05/05/2021 Elsevier Patient Education  2023 Elsevier Inc.  

## 2022-07-05 ENCOUNTER — Encounter: Payer: Self-pay | Admitting: Family Medicine

## 2022-07-06 ENCOUNTER — Encounter: Payer: Self-pay | Admitting: *Deleted

## 2022-07-12 ENCOUNTER — Ambulatory Visit
Admission: RE | Admit: 2022-07-12 | Discharge: 2022-07-12 | Disposition: A | Discharge status: Home / Self Care | Payer: BC Managed Care – PPO | Source: Ambulatory Visit | Attending: Family Medicine | Admitting: Family Medicine

## 2022-07-12 DIAGNOSIS — Z1231 Encounter for screening mammogram for malignant neoplasm of breast: Secondary | ICD-10-CM

## 2022-07-13 ENCOUNTER — Telehealth (INDEPENDENT_AMBULATORY_CARE_PROVIDER_SITE_OTHER): Payer: BC Managed Care – PPO | Admitting: Family Medicine

## 2022-07-13 DIAGNOSIS — R42 Dizziness and giddiness: Secondary | ICD-10-CM | POA: Insufficient documentation

## 2022-07-13 DIAGNOSIS — F419 Anxiety disorder, unspecified: Secondary | ICD-10-CM

## 2022-07-13 DIAGNOSIS — F32A Depression, unspecified: Secondary | ICD-10-CM | POA: Diagnosis not present

## 2022-07-13 NOTE — Assessment & Plan Note (Signed)
Unclear etiology, does not align with typical peri-menopause or other usual hormonal fluctuations. Will check labs and consider f/u with PCP who might investigate other causes.

## 2022-07-13 NOTE — Progress Notes (Signed)
GYNECOLOGY VIRTUAL VISIT ENCOUNTER NOTE  Provider location: [facility]   Patient location: Home  I connected with [name] on 07/13/22 at  3:50 PM EDT by MyChart Video Encounter and verified that I am speaking with the correct person using two identifiers.   I discussed the limitations, risks, security and privacy concerns of performing an evaluation and management service virtually and the availability of in person appointments. I also discussed with the patient that there may be a patient responsible charge related to this service. The patient expressed understanding and agreed to proceed.   History:  [name] is a 41 y.o. G2P2 female being evaluated today for dizziness, which seems worse with her cycles. Cycle was late by a few days. This has been going on for a few years.  Had associated nausea and almost threw up. Felt like she might pass out. Comes and goes. Worse if on feet too long. Cycles were lighter and had no cramping.  No room spinning.  Has to work from home. Has a seizure hx and is worried.  ? Panic attacks, but does not feel like it is the same. She denies any abnormal vaginal discharge, bleeding, pelvic pain or other concerns.       Past Medical History:  Diagnosis Date   Chronic headaches    Colon polyps    Epilepsy ([place])    last episode  August 2020   Seasonal allergies    Seizures [facility])    Past Surgical History:  Procedure Laterality Date   CESAREAN SECTION  2005, 2010   x2   COLONOSCOPY  2014   COLONOSCOPY  2017   KN-MAC-suprep(exc)-TA x 1   IRRIGATION AND DEBRIDEMENT SEBACEOUS CYST  1985   POLYPECTOMY  2017   TA   WISDOM TOOTH EXTRACTION  2001   The following portions of the patient's history were reviewed and updated as appropriate: allergies, current medications, past family history, past medical history, past social history, past surgical history and problem list.   Health Maintenance:  Normal pap and  negative HRHPV on 05/25/2021.  Mammogram on 07/12/2022.   Review of Systems:  Pertinent items noted in HPI and remainder of comprehensive ROS otherwise negative.  Physical Exam:   General:  Alert, oriented and cooperative. Patient appears to be in no acute distress.  Mental Status: Normal mood and affect. Normal behavior. Normal judgment and thought content.   Respiratory: Normal respiratory effort, no problems with respiration noted  Rest of physical exam deferred due to type of encounter  Labs and Imaging No results found for this or any previous visit (from the past 336 hour(s)). No results found.     Assessment and Plan:     Problem List Items Addressed This Visit       Unprioritized   Anxiety and depression    Does not think she is having panic attacks, but certainly this could be playing a role.      Dizziness - Primary    Unclear etiology, does not align with typical peri-menopause or other usual hormonal fluctuations. Will check labs and consider f/u with PCP who might investigate other causes.      Relevant Orders   CBC   Follicle stimulating hormone   TSH   Comprehensive metabolic panel   Hemoglobin A1c   VITAMIN D 25 Hydroxy (Vit-D Deficiency, Fractures)         I discussed the assessment and treatment plan with  the patient. The patient was provided an opportunity to ask questions and all were answered. The patient agreed with the plan and demonstrated an understanding of the instructions.   The patient was advised to call back or seek an in-person evaluation/go to the ED if the symptoms worsen or if the condition fails to improve as anticipated.  I provided 16 minutes of face-to-face time during this encounter.   Donnamae Jude, MD [facility], [place]

## 2022-07-13 NOTE — Assessment & Plan Note (Signed)
Does not think she is having panic attacks, but certainly this could be playing a role.

## 2022-07-13 NOTE — Progress Notes (Signed)
Having dizziness, doesn't happen every day but when it does it is all day It used to happen occ now its happening more often Denies any new meds or change in health history

## 2022-07-15 ENCOUNTER — Other Ambulatory Visit: Payer: Self-pay | Admitting: Family Medicine

## 2022-07-15 DIAGNOSIS — R928 Other abnormal and inconclusive findings on diagnostic imaging of breast: Secondary | ICD-10-CM

## 2022-07-20 ENCOUNTER — Other Ambulatory Visit: Payer: BC Managed Care – PPO

## 2022-07-20 DIAGNOSIS — R42 Dizziness and giddiness: Secondary | ICD-10-CM

## 2022-07-20 LAB — CBC
Hematocrit: 41.2 % (ref 34.0–46.6)
Hemoglobin: 13.6 g/dL (ref 11.1–15.9)
MCH: 31.4 pg (ref 26.6–33.0)
MCHC: 33 g/dL (ref 31.5–35.7)
MCV: 95 fL (ref 79–97)
Platelets: 276 10*3/uL (ref 150–450)
RBC: 4.33 x10E6/uL (ref 3.77–5.28)
RDW: 12.1 % (ref 11.7–15.4)
WBC: 5.9 10*3/uL (ref 3.4–10.8)

## 2022-07-20 LAB — HEMOGLOBIN A1C
Est. average glucose Bld gHb Est-mCnc: 105 mg/dL
Hgb A1c MFr Bld: 5.3 % (ref 4.8–5.6)

## 2022-07-21 LAB — COMPREHENSIVE METABOLIC PANEL
ALT: 19 IU/L (ref 0–32)
AST: 18 IU/L (ref 0–40)
Albumin/Globulin Ratio: 1.8 (ref 1.2–2.2)
Albumin: 4.6 g/dL (ref 3.9–4.9)
Alkaline Phosphatase: 62 IU/L (ref 44–121)
BUN/Creatinine Ratio: 13 (ref 9–23)
BUN: 10 mg/dL (ref 6–24)
Bilirubin Total: 0.3 mg/dL (ref 0.0–1.2)
CO2: 22 mmol/L (ref 20–29)
Calcium: 9.4 mg/dL (ref 8.7–10.2)
Chloride: 103 mmol/L (ref 96–106)
Creatinine, Ser: 0.8 mg/dL (ref 0.57–1.00)
Globulin, Total: 2.5 g/dL (ref 1.5–4.5)
Glucose: 93 mg/dL (ref 70–99)
Potassium: 4.5 mmol/L (ref 3.5–5.2)
Sodium: 139 mmol/L (ref 134–144)
Total Protein: 7.1 g/dL (ref 6.0–8.5)
eGFR: 95 mL/min/{1.73_m2} (ref >59)

## 2022-07-21 LAB — TSH: TSH: 4.33 u[IU]/mL (ref 0.450–4.500)

## 2022-07-21 LAB — VITAMIN D 25 HYDROXY (VIT D DEFICIENCY, FRACTURES): Vit D, 25-Hydroxy: 28.6 ng/mL — ABNORMAL LOW (ref 30.0–100.0)

## 2022-07-21 LAB — FOLLICLE STIMULATING HORMONE: FSH: 4 m[IU]/mL

## 2022-07-27 ENCOUNTER — Other Ambulatory Visit: Payer: Self-pay | Admitting: Family Medicine

## 2022-07-27 ENCOUNTER — Ambulatory Visit
Admission: RE | Admit: 2022-07-27 | Discharge: 2022-07-27 | Disposition: A | Discharge status: Home / Self Care | Payer: BC Managed Care – PPO | Source: Ambulatory Visit | Attending: Family Medicine | Admitting: Family Medicine

## 2022-07-27 DIAGNOSIS — R928 Other abnormal and inconclusive findings on diagnostic imaging of breast: Secondary | ICD-10-CM

## 2022-07-27 DIAGNOSIS — N6489 Other specified disorders of breast: Secondary | ICD-10-CM

## 2022-07-27 DIAGNOSIS — N6012 Diffuse cystic mastopathy of left breast: Secondary | ICD-10-CM | POA: Diagnosis not present

## 2022-07-27 DIAGNOSIS — R922 Inconclusive mammogram: Secondary | ICD-10-CM | POA: Diagnosis not present

## 2022-08-17 ENCOUNTER — Ambulatory Visit: Payer: BC Managed Care – PPO | Admitting: Family Medicine

## 2022-08-30 DIAGNOSIS — G40309 Generalized idiopathic epilepsy and epileptic syndromes, not intractable, without status epilepticus: Secondary | ICD-10-CM | POA: Diagnosis not present

## 2022-08-30 DIAGNOSIS — H8110 Benign paroxysmal vertigo, unspecified ear: Secondary | ICD-10-CM | POA: Diagnosis not present

## 2022-08-30 DIAGNOSIS — G43809 Other migraine, not intractable, without status migrainosus: Secondary | ICD-10-CM | POA: Diagnosis not present

## 2023-01-25 ENCOUNTER — Ambulatory Visit
Admission: RE | Admit: 2023-01-25 | Discharge: 2023-01-25 | Disposition: A | Discharge status: Home / Self Care | Payer: BC Managed Care – PPO | Source: Ambulatory Visit | Attending: Family Medicine | Admitting: Family Medicine

## 2023-01-25 ENCOUNTER — Ambulatory Visit: Payer: BC Managed Care – PPO

## 2023-01-25 DIAGNOSIS — N6489 Other specified disorders of breast: Secondary | ICD-10-CM

## 2023-01-25 DIAGNOSIS — R922 Inconclusive mammogram: Secondary | ICD-10-CM | POA: Diagnosis not present

## 2023-02-06 ENCOUNTER — Other Ambulatory Visit: Payer: Self-pay | Admitting: Family Medicine

## 2023-02-06 DIAGNOSIS — N6489 Other specified disorders of breast: Secondary | ICD-10-CM

## 2023-08-30 ENCOUNTER — Encounter: Payer: Self-pay | Admitting: Family Medicine

## 2023-08-30 ENCOUNTER — Ambulatory Visit: Payer: BC Managed Care – PPO | Admitting: Family Medicine

## 2023-08-30 VITALS — BP 132/79 | HR 98 | Ht 67.0 in | Wt 194.8 lb

## 2023-08-30 DIAGNOSIS — Z01419 Encounter for gynecological examination (general) (routine) without abnormal findings: Secondary | ICD-10-CM

## 2023-08-30 DIAGNOSIS — R42 Dizziness and giddiness: Secondary | ICD-10-CM

## 2023-08-30 NOTE — Progress Notes (Signed)
Patient presents for Annual.  LMP: 08/25/23  Last pap: Date: 05/25/21 -WNL  Contraception: None Mammogram: Up to date: 01/25/23 STD Screening: not indicated Flu Vaccine : Declines  CC:  Annual  Denies Any concerns

## 2023-08-30 NOTE — Patient Instructions (Signed)

## 2023-08-30 NOTE — Progress Notes (Signed)
Subjective:     [name] is a 42 y.o. female and is here for a comprehensive physical exam. The patient reports no problems. Cycles are regular.  The following portions of the patient's history were reviewed and updated as appropriate: allergies, current medications, past family history, past medical history, past social history, past surgical history, and problem list.  Review of Systems Pertinent items noted in HPI and remainder of comprehensive ROS otherwise negative.   Objective:    BP 132/79   Pulse 98   Ht 5\' 7"  (1.702 m)   Wt 194 lb 12.8 oz (88.4 kg)   BMI 30.51 kg/m  General appearance: alert, cooperative, and appears stated age Head: Normocephalic, without obvious abnormality, atraumatic Neck: no adenopathy, supple, symmetrical, trachea midline, and thyroid not enlarged, symmetric, no tenderness/mass/nodules Lungs: clear to auscultation bilaterally Heart: regular rate and rhythm, S1, S2 normal, no murmur, click, rub or gallop Abdomen: soft, non-tender; bowel sounds normal; no masses,  no organomegaly Extremities: extremities normal, atraumatic, no cyanosis or edema Skin: Skin color, texture, turgor normal. No rashes or lesions Neurologic: Grossly normal    Assessment:    Healthy female exam.      Plan:   Problem List Items Addressed This Visit       Unprioritized   Dizziness    Thought to be possible migraine.      Other Visit Diagnoses     Encounter for gynecological examination without abnormal finding    -  Primary   Relevant Orders   TSH   CBC   Comprehensive metabolic panel   Hemoglobin A1c   Lipid panel      Return in 1 year (on 08/29/2024).    See After Visit Summary for Counseling Recommendations

## 2023-08-30 NOTE — Assessment & Plan Note (Signed)
Thought to be possible migraine.

## 2023-08-31 LAB — COMPREHENSIVE METABOLIC PANEL
ALT: 12 [IU]/L (ref 0–32)
AST: 16 [IU]/L (ref 0–40)
Albumin: 4.6 g/dL (ref 3.9–4.9)
Alkaline Phosphatase: 79 [IU]/L (ref 44–121)
BUN/Creatinine Ratio: 11 (ref 9–23)
BUN: 9 mg/dL (ref 6–24)
Bilirubin Total: <0.2 mg/dL (ref 0.0–1.2)
CO2: 23 mmol/L (ref 20–29)
Calcium: 9.5 mg/dL (ref 8.7–10.2)
Chloride: 101 mmol/L (ref 96–106)
Creatinine, Ser: 0.8 mg/dL (ref 0.57–1.00)
Globulin, Total: 2.7 g/dL (ref 1.5–4.5)
Glucose: 106 mg/dL — ABNORMAL HIGH (ref 70–99)
Potassium: 4.1 mmol/L (ref 3.5–5.2)
Sodium: 140 mmol/L (ref 134–144)
Total Protein: 7.3 g/dL (ref 6.0–8.5)
eGFR: 95 mL/min/{1.73_m2} (ref >59)

## 2023-08-31 LAB — HEMOGLOBIN A1C
Est. average glucose Bld gHb Est-mCnc: 105 mg/dL
Hgb A1c MFr Bld: 5.3 % (ref 4.8–5.6)

## 2023-08-31 LAB — LIPID PANEL
Chol/HDL Ratio: 3 {ratio} (ref 0.0–4.4)
Cholesterol, Total: 191 mg/dL (ref 100–199)
HDL: 64 mg/dL (ref >39)
LDL Chol Calc (NIH): 107 mg/dL — ABNORMAL HIGH (ref 0–99)
Triglycerides: 115 mg/dL (ref 0–149)
VLDL Cholesterol Cal: 20 mg/dL (ref 5–40)

## 2023-08-31 LAB — CBC
Hematocrit: 40.2 % (ref 34.0–46.6)
Hemoglobin: 13.1 g/dL (ref 11.1–15.9)
MCH: 31.3 pg (ref 26.6–33.0)
MCHC: 32.6 g/dL (ref 31.5–35.7)
MCV: 96 fL (ref 79–97)
Platelets: 298 10*3/uL (ref 150–450)
RBC: 4.19 x10E6/uL (ref 3.77–5.28)
RDW: 12.1 % (ref 11.7–15.4)
WBC: 8.6 10*3/uL (ref 3.4–10.8)

## 2023-08-31 LAB — TSH: TSH: 2.21 u[IU]/mL (ref 0.450–4.500)

## 2023-10-25 DIAGNOSIS — G40309 Generalized idiopathic epilepsy and epileptic syndromes, not intractable, without status epilepticus: Secondary | ICD-10-CM | POA: Diagnosis not present

## 2023-10-25 DIAGNOSIS — Z1330 Encounter for screening examination for mental health and behavioral disorders, unspecified: Secondary | ICD-10-CM | POA: Diagnosis not present

## 2024-01-19 DIAGNOSIS — Y92813 Airplane as the place of occurrence of the external cause: Secondary | ICD-10-CM | POA: Diagnosis not present

## 2024-01-19 DIAGNOSIS — W01198A Fall on same level from slipping, tripping and stumbling with subsequent striking against other object, initial encounter: Secondary | ICD-10-CM | POA: Diagnosis not present

## 2024-01-19 DIAGNOSIS — S83004A Unspecified dislocation of right patella, initial encounter: Secondary | ICD-10-CM | POA: Diagnosis not present

## 2024-01-19 DIAGNOSIS — M25561 Pain in right knee: Secondary | ICD-10-CM | POA: Diagnosis not present

## 2024-01-19 DIAGNOSIS — S8991XA Unspecified injury of right lower leg, initial encounter: Secondary | ICD-10-CM | POA: Diagnosis not present

## 2024-01-29 DIAGNOSIS — S8391XA Sprain of unspecified site of right knee, initial encounter: Secondary | ICD-10-CM | POA: Diagnosis not present

## 2024-09-03 ENCOUNTER — Encounter: Payer: Self-pay | Admitting: Family Medicine

## 2024-09-03 ENCOUNTER — Ambulatory Visit: Admitting: Family Medicine

## 2024-09-03 ENCOUNTER — Other Ambulatory Visit (HOSPITAL_COMMUNITY)
Admission: RE | Admit: 2024-09-03 | Discharge: 2024-09-03 | Disposition: A | Source: Ambulatory Visit | Attending: Family Medicine | Admitting: Family Medicine

## 2024-09-03 VITALS — BP 117/83 | HR 73 | Ht 67.0 in | Wt 193.0 lb

## 2024-09-03 DIAGNOSIS — Z01419 Encounter for gynecological examination (general) (routine) without abnormal findings: Secondary | ICD-10-CM | POA: Diagnosis not present

## 2024-09-03 DIAGNOSIS — Z124 Encounter for screening for malignant neoplasm of cervix: Secondary | ICD-10-CM | POA: Diagnosis not present

## 2024-09-03 DIAGNOSIS — Z1231 Encounter for screening mammogram for malignant neoplasm of breast: Secondary | ICD-10-CM

## 2024-09-03 NOTE — Patient Instructions (Signed)

## 2024-09-03 NOTE — Progress Notes (Signed)
 Subjective:     [name] is a 43 y.o. female and is here for a comprehensive physical exam. The patient reports no problems.  Social History   Socioeconomic History   Marital status: Married    Spouse name: Not on file   Number of children: 2   Years of education: Not on file   Highest education level: Not on file  Occupational History   Occupation: Real Estate Admin    Employer: [employer]  Tobacco Use   Smoking status: Never   Smokeless tobacco: Never  Vaping Use   Vaping status: Never Used  Substance and Sexual Activity   Alcohol use: Yes    Alcohol/week: 7.0 standard drinks of alcohol    Types: 7 Standard drinks or equivalent per week    Comment: 1 per day   Drug use: No   Sexual activity: Yes    Partners: Male    Birth control/protection: None  Other Topics Concern   Not on file  Social History Narrative   Not on file   Social Drivers of Health   Financial Resource Strain: Patient Declined (10/24/2023)   Received from [facility]   Overall Financial Resource Strain (CARDIA)    Difficulty of Paying Living Expenses: Patient declined  Food Insecurity: Patient Declined (10/24/2023)   Received from [facility]   Hunger Vital Sign    Within the past 12 months, you worried that your food would run out before you got the money to buy more.: Patient declined    Within the past 12 months, the food you bought just didn't last and you didn't have money to get more.: Patient declined  Transportation Needs: Patient Declined (10/24/2023)   Received from [facility] - Transportation    Lack of Transportation (Medical): Patient declined    Lack of Transportation (Non-Medical): Patient declined  Physical Activity: Unknown (10/24/2023)   Received from [facility]   Exercise Vital Sign    On average, how many days per week do you engage in moderate to strenuous exercise (like a brisk walk)?: Patient declined    Minutes of Exercise per Session: Not on file  Stress:  Patient Declined (10/24/2023)   Received from [facility] - Occupational Stress Questionnaire    Feeling of Stress : Patient declined  Social Connections: Patient Declined (10/24/2023)   Received from [facility]   Social Network    How would you rate your social network (family, work, friends)?: Patient declined  Intimate Partner Violence: Not At Risk (10/24/2023)   Received from [facility]   HITS    Over the last 12 months how often did your partner physically hurt you?: Never    Over the last 12 months how often did your partner insult you or talk down to you?: Never    Over the last 12 months how often did your partner threaten you with physical harm?: Never    Over the last 12 months how often did your partner scream or curse at you?: Never   Health Maintenance  Topic Date Due   Hepatitis C Screening  Never done   DTaP/Tdap/Td (1 - Tdap) Never done   Hepatitis B Vaccines 19-59 Average Risk (1 of 3 - 19+ 3-dose series) Never done   HPV VACCINES (1 - 3-dose SCDM series) Never done   Influenza Vaccine  Never done   COVID-19 Vaccine (1 - 2025-26 season) Never done   Mammogram  07/12/2024   Cervical Cancer Screening (HPV/Pap Cotest)  05/25/2026   Colonoscopy  01/28/2029   HIV Screening  Completed   Pneumococcal Vaccine  Aged Out   Meningococcal B Vaccine  Aged Out    The following portions of the patient's history were reviewed and updated as appropriate: allergies, current medications, past family history, past medical history, past social history, past surgical history, and problem list.  Review of Systems Pertinent items noted in HPI and remainder of comprehensive ROS otherwise negative.   Objective:  Chaperone present for exam   BP 117/83   Pulse 73   Ht 5' 7 (1.702 m)   Wt 193 lb (87.5 kg)   LMP 08/09/2024   BMI 30.23 kg/m  General appearance: alert, cooperative, and appears stated age Head: Normocephalic, without  obvious abnormality, atraumatic Neck: no adenopathy, supple, symmetrical, trachea midline, and thyroid  not enlarged, symmetric, no tenderness/mass/nodules Lungs: clear to auscultation bilaterally Breasts: normal appearance, no masses or tenderness Heart: regular rate and rhythm, S1, S2 normal, no murmur, click, rub or gallop Abdomen: soft, non-tender; bowel sounds normal; no masses,  no organomegaly Pelvic: cervix normal in appearance, external genitalia normal, no adnexal masses or tenderness, no cervical motion tenderness, uterus normal size, shape, and consistency, and vagina normal without discharge Extremities: extremities normal, atraumatic, no cyanosis or edema Pulses: 2+ and symmetric Skin: Skin color, texture, turgor normal. No rashes or lesions Lymph nodes: Cervical, supraclavicular, and axillary nodes normal. Neurologic: Grossly normal       08/30/2023    3:08 PM  GAD 7 : Generalized Anxiety Score  Nervous, Anxious, on Edge 0  Control/stop worrying 0  Worry too much - different things 0  Trouble relaxing 0  Restless 0  Easily annoyed or irritable 0  Afraid - awful might happen 0  Total GAD 7 Score 0  Anxiety Difficulty Not difficult at all       08/30/2023    3:08 PM  PHQ9 SCORE ONLY  PHQ-9 Total Score 1      Data saved with a previous flowsheet row definition    Assessment:    Healthy female exam.      Plan:  Screening for malignant neoplasm of cervix - Plan: Cytology - PAP  Encounter for gynecological examination without abnormal finding - PHQ 9 and GAD 7 negative today. Annual labs. Declined flu shot. - Plan: CBC, TSH, Hemoglobin A1c, Comprehensive metabolic panel with GFR, Lipid panel  Encounter for screening mammogram for malignant neoplasm of breast - Plan: MM 3D SCREENING MAMMOGRAM BILATERAL BREAST  Return in 1 year (on 09/03/2025).    See After Visit Summary for Counseling Recommendations

## 2024-09-03 NOTE — Progress Notes (Signed)
 Patient presents for Annual.  LMP: 08/09/2024 Last pap: 05/25/2021 WNL  Contraception: None Mammogram: 01/25/23 [facility]  STD Screening: Declines Flu Vaccine : Declines  CC: Annual/None

## 2024-09-04 ENCOUNTER — Ambulatory Visit: Payer: Self-pay | Admitting: Family Medicine

## 2024-09-04 LAB — CBC
Hematocrit: 41 % (ref 34.0–46.6)
Hemoglobin: 13.2 g/dL (ref 11.1–15.9)
MCH: 30.9 pg (ref 26.6–33.0)
MCHC: 32.2 g/dL (ref 31.5–35.7)
MCV: 96 fL (ref 79–97)
Platelets: 333 x10E3/uL (ref 150–450)
RBC: 4.27 x10E6/uL (ref 3.77–5.28)
RDW: 11.9 % (ref 11.7–15.4)
WBC: 7.9 x10E3/uL (ref 3.4–10.8)

## 2024-09-04 LAB — COMPREHENSIVE METABOLIC PANEL WITH GFR
ALT: 15 IU/L (ref 0–32)
AST: 18 IU/L (ref 0–40)
Albumin: 4.6 g/dL (ref 3.9–4.9)
Alkaline Phosphatase: 78 IU/L (ref 41–116)
BUN/Creatinine Ratio: 10 (ref 9–23)
BUN: 8 mg/dL (ref 6–24)
Bilirubin Total: 0.3 mg/dL (ref 0.0–1.2)
CO2: 21 mmol/L (ref 20–29)
Calcium: 9.5 mg/dL (ref 8.7–10.2)
Chloride: 100 mmol/L (ref 96–106)
Creatinine, Ser: 0.78 mg/dL (ref 0.57–1.00)
Globulin, Total: 2.7 g/dL (ref 1.5–4.5)
Glucose: 84 mg/dL (ref 70–99)
Potassium: 4.3 mmol/L (ref 3.5–5.2)
Sodium: 137 mmol/L (ref 134–144)
Total Protein: 7.3 g/dL (ref 6.0–8.5)
eGFR: 97 mL/min/1.73 (ref >59)

## 2024-09-04 LAB — HEMOGLOBIN A1C
Est. average glucose Bld gHb Est-mCnc: 103 mg/dL
Hgb A1c MFr Bld: 5.2 % (ref 4.8–5.6)

## 2024-09-04 LAB — LIPID PANEL
Chol/HDL Ratio: 3.6 ratio (ref 0.0–4.4)
Cholesterol, Total: 183 mg/dL (ref 100–199)
HDL: 51 mg/dL (ref >39)
LDL Chol Calc (NIH): 118 mg/dL — ABNORMAL HIGH (ref 0–99)
Triglycerides: 76 mg/dL (ref 0–149)
VLDL Cholesterol Cal: 14 mg/dL (ref 5–40)

## 2024-09-04 LAB — TSH: TSH: 2.74 u[IU]/mL (ref 0.450–4.500)

## 2024-09-06 LAB — CYTOLOGY - PAP
Adequacy: ABSENT
Comment: NEGATIVE
Comment: NEGATIVE
Comment: NEGATIVE
Diagnosis: NEGATIVE
HPV 16: NEGATIVE
HPV 18 / 45: NEGATIVE
High risk HPV: POSITIVE — AB

## 2024-09-09 ENCOUNTER — Encounter: Payer: Self-pay | Admitting: Family Medicine

## 2024-09-09 DIAGNOSIS — R87810 Cervical high risk human papillomavirus (HPV) DNA test positive: Secondary | ICD-10-CM | POA: Insufficient documentation

## 2024-10-24 DIAGNOSIS — G40309 Generalized idiopathic epilepsy and epileptic syndromes, not intractable, without status epilepticus: Secondary | ICD-10-CM | POA: Diagnosis not present

## 2024-10-24 DIAGNOSIS — Z1330 Encounter for screening examination for mental health and behavioral disorders, unspecified: Secondary | ICD-10-CM | POA: Diagnosis not present
